# Patient Record
Sex: Male | Born: 1980 | Hispanic: Yes | Marital: Single | Smoking: Never smoker
Health system: Southern US, Community
[De-identification: ages and names within clinical notes are randomized; demographics above are authoritative.]

## PROBLEM LIST (undated history)

## (undated) DIAGNOSIS — I495 Sick sinus syndrome: Secondary | ICD-10-CM

## (undated) DIAGNOSIS — D490 Neoplasm of unspecified behavior of digestive system: Secondary | ICD-10-CM

## (undated) DIAGNOSIS — Z950 Presence of cardiac pacemaker: Secondary | ICD-10-CM

## (undated) DIAGNOSIS — K759 Inflammatory liver disease, unspecified: Secondary | ICD-10-CM

## (undated) DIAGNOSIS — D37039 Neoplasm of uncertain behavior of the major salivary glands, unspecified: Secondary | ICD-10-CM

## (undated) DIAGNOSIS — R220 Localized swelling, mass and lump, head: Secondary | ICD-10-CM

## (undated) DIAGNOSIS — K219 Gastro-esophageal reflux disease without esophagitis: Secondary | ICD-10-CM

## (undated) HISTORY — DX: Sick sinus syndrome: I49.5

## (undated) HISTORY — DX: Gastro-esophageal reflux disease without esophagitis: K21.9

## (undated) HISTORY — DX: Localized swelling, mass and lump, head: R22.0

## (undated) HISTORY — DX: Neoplasm of uncertain behavior of the major salivary glands, unspecified: D37.039

## (undated) HISTORY — DX: Presence of cardiac pacemaker: Z95.0

## (undated) HISTORY — DX: Neoplasm of unspecified behavior of digestive system: D49.0

## (undated) HISTORY — PX: LEG SURGERY: SHX1003

---

## 2005-05-01 DIAGNOSIS — Z950 Presence of cardiac pacemaker: Secondary | ICD-10-CM

## 2005-05-01 HISTORY — DX: Presence of cardiac pacemaker: Z95.0

## 2005-05-06 HISTORY — PX: PACEMAKER INSERTION: SHX728

## 2008-10-23 ENCOUNTER — Encounter: Payer: Self-pay | Admitting: Internal Medicine

## 2010-03-10 ENCOUNTER — Telehealth (INDEPENDENT_AMBULATORY_CARE_PROVIDER_SITE_OTHER): Payer: Self-pay | Admitting: *Deleted

## 2010-04-08 ENCOUNTER — Encounter (INDEPENDENT_AMBULATORY_CARE_PROVIDER_SITE_OTHER): Payer: Self-pay | Admitting: *Deleted

## 2010-04-18 ENCOUNTER — Encounter: Payer: Self-pay | Admitting: Internal Medicine

## 2010-04-18 ENCOUNTER — Ambulatory Visit: Payer: Self-pay | Admitting: Internal Medicine

## 2010-04-18 DIAGNOSIS — I495 Sick sinus syndrome: Secondary | ICD-10-CM | POA: Insufficient documentation

## 2010-05-28 ENCOUNTER — Ambulatory Visit: Payer: Self-pay | Admitting: Internal Medicine

## 2010-06-27 ENCOUNTER — Ambulatory Visit: Admit: 2010-06-27 | Payer: Self-pay | Admitting: Internal Medicine

## 2010-07-01 NOTE — Letter (Signed)
Summary: Primary Care Appointment Letter  [facility] [place]  [address] [place], [place] [address]   Phone: [phone]  Fax: [phone]    04/08/2010 MRN: [id number]  [name] [place], [place]  [address]  Dear Ms. Chase Parker,   Your Primary Care Physician  has indicated that:    _______it is time to schedule an appointment.    _______you missed your appointment on______ and need to call and          reschedule.    _______you need to have lab work done.    _______you need to schedule an appointment discuss lab or test results.    ___xxxx_you need to call to reschedule your appointment for a physical that is scheduled on 04/18/2010.   Dr Drue Novel will not be in that day.     Please call our office as soon as possible. Our phone number is [phone]. Please press option 1. Our office is open 8a-12noon and 1p-5p, Monday through Friday.     Thank you,     Sarah at [facility] [address]   [facility]

## 2010-07-01 NOTE — Letter (Signed)
Summary: Dr. Horton Chin - Electrophysiologist/Cardiologist  Dr. Horton Chin - Electrophysiologist/Cardiologist   Imported By: Marylou Mccoy 05/01/2010 16:24:09  _____________________________________________________________________  External Attachment:    Type:   Image     Comment:   External Document

## 2010-07-01 NOTE — Cardiovascular Report (Signed)
Summary: Office Visit   Office Visit   Imported By: Roderic Ovens 04/21/2010 15:54:25  _____________________________________________________________________  External Attachment:    Type:   Image     Comment:   External Document

## 2010-07-01 NOTE — Procedures (Signed)
Summary: Cardiology Device Clinic   [facility] Specifications Following MD:  Hillis Range, MD     PPM Vendor:  St Jude     PPM Model Number:  [id number]     PPM Serial Number:  [id number] PPM DOI:  05/05/2005     PPM Implanting MD:  NOT IMPLANTED BY [place]  Lead 1    Location: RA     DOI: 05/05/2005     Model #: [id number]     Serial #: [id number]     Status: active Lead 2    Location: RV     DOI: 05/05/2005     Model #: [id number]     Serial #: [id number]     Status: active  Magnet Response Rate:  BOL 98.6 ERI 86.3  Indications:  Syncope   PPM Follow Up Remote Check?  No Battery Voltage:  2.78 V     Battery Est. Longevity:  4.5 YEARS     Pacer Dependent:  No       PPM Device Measurements Atrium  Amplitude: 3.3 mV, Impedance: 558 ohms, Threshold: 0.5 V at 0.4 msec Right Ventricle  Amplitude: 9.3 mV, Impedance: 391 ohms, Threshold: 1.5 V at 1.0 msec  Episodes MS Episodes:  0     Percent Mode Switch:  0     Coumadin:  No Atrial Pacing:  7.7%     Ventricular Pacing:  <1%  Parameters Mode:  ddd     Lower Rate Limit:  55     Upper Rate Limit:  125 Paced AV Delay:  250     Sensed AV Delay:  225 Tech Comments:  No parameter changes.  New to our practice, implanted in [place] for syncope.  Checked by [employer].  ROV 6 months with Dr. Johney Frame. Altha Harm, LPN  April 18, 2010 2:01 PM

## 2010-07-01 NOTE — Assessment & Plan Note (Signed)
Summary: np6/syncope-mb   Visit Type:  Initial Consult  CC:  Cardiology consult.  History of Present Illness: patient is a 30 year old with a history of syncope in the past.  He was seen by a cardiologist in [place].   work up included a tilt table that showed sinus arrest.  he underwent PPM placement.  Since then he has no furhter episodes of syncope.  No dizziness.  He says he can occasionally feel a discomfort in chest as if it is his pacer kicking in when he increases his activity. Ne deneis other chest discomfort.  He does not exercise regularly He was last seen by cardiol about 7 months ago.  Pacer at time was working well.  He said he paces about 10% of time.   He moved to [facility] and presents for continued cardiac care. per his reprot lipids were good. in past.  Current Medications (verified): 1)  No Meds  Allergies (verified): No Known Drug Allergies  Past History:  Past Medical History: SA node dysfunction, s/p PPM  (St Jude) GERD  Past Surgical History: PPM 2996 Repair broken leg 1992  Family History: Reviewed history and no changes required. No premature CAD  Social History: Reviewed history and no changes required. No tobacco No ETOH Works for [employer].  Review of Systems       All systems reviewed.  Neg to the above problem except as noted.  Vital Signs:  Patient profile:   30 year old male Height:      67 inches Weight:      216.50 pounds BMI:     34.03 Pulse rate:   96 / minute Pulse rhythm:   regular Resp:     18 per minute BP sitting:   121 / 75  (left arm) Cuff size:   large  Vitals Entered By: Vikki Ports (April 18, 2010 11:05 AM)  Physical Exam  Additional Exam:  patient is in NAD HEENT:  Normocephalic, atraumatic. EOMI, PERRLA.  Neck: JVP is normal. No thyromegaly. No bruits.  Lungs: clear to auscultation. No rales no wheezes.  Heart: Regular rate and rhythm. Normal S1, S2. No S3.   No significant murmurs. PMI not  displaced.  Abdomen:  Supple, nontender. Normal bowel sounds. No masses. No hepatomegaly.  Extremities:   Good distal pulses throughout. No lower extremity edema.  Musculoskeletal :moving all extremities.  Neuro:   alert and oriented x3.    EKG  Procedure date:  04/18/2010  Findings:      NSR  96 bpm.  Impression & Recommendations:  Problem # 1:  SINOATRIAL NODE DYSFUNCTION (ICD-427.81) Patient is doing well. Pacer interrogated.   Working well.  Ventricular output increased to expand safety margin will need f/u with EP in future  will arrange for appt next spring Wating for records from [facility]  Problem # 2:  PREVENTIVE HEALTH CARE (ICD-V70.0) Counselled on exercise, diet Will need to have lipids checked once establishes with primary MD.  Other Orders: EKG w/ Interpretation (93000) Primary Care Referral (Primary)  Patient Instructions: 1)  Your physician recommends that you schedule a follow-up appointment in: 6 months with Dr.Allred for PACER CHECK 2)  Set up with Dr. Drue Novel for primary care.

## 2010-07-01 NOTE — Progress Notes (Signed)
  Pt signed ROI, needed to get records from Dr.John's office in [place] faxed to [phone] call back [phone] pt needs to [facility] NP appt [facility]  March 10, 2010 12:55 PM     Appended Document:  Records Received from Dr.John's Office in [place] gave to Peter ([employer])

## 2011-01-30 ENCOUNTER — Encounter: Payer: Self-pay | Admitting: *Deleted

## 2011-05-13 ENCOUNTER — Encounter: Payer: Self-pay | Admitting: Internal Medicine

## 2011-07-10 ENCOUNTER — Telehealth: Payer: Self-pay | Admitting: Internal Medicine

## 2011-07-10 NOTE — Telephone Encounter (Signed)
05-13-11 sent past due letter/mt °

## 2011-07-29 ENCOUNTER — Encounter: Payer: Self-pay | Admitting: Internal Medicine

## 2011-07-29 ENCOUNTER — Ambulatory Visit (INDEPENDENT_AMBULATORY_CARE_PROVIDER_SITE_OTHER): Payer: Private Health Insurance - Indemnity | Admitting: Internal Medicine

## 2011-07-29 VITALS — BP 120/78 | HR 58 | Ht 67.0 in | Wt 212.8 lb

## 2011-07-29 DIAGNOSIS — E781 Pure hyperglyceridemia: Secondary | ICD-10-CM

## 2011-07-29 DIAGNOSIS — I495 Sick sinus syndrome: Secondary | ICD-10-CM

## 2011-07-29 LAB — PACEMAKER DEVICE OBSERVATION
AL IMPEDENCE PM: 645 Ohm
AL THRESHOLD: 0.5 V
ATRIAL PACING PM: 8
BATTERY VOLTAGE: 2.76 V
RV LEAD IMPEDENCE PM: 351 Ohm

## 2011-07-29 NOTE — Patient Instructions (Signed)
Your physician wants you to follow-up in: 6 months with device clinic and 12 months with Dr Johney Frame Bonita Quin will receive a reminder letter in the mail two months in advance. If you don't receive a letter, please call our office to schedule the follow-up appointment.   You have been referred to Dr Drue Novel

## 2011-08-02 ENCOUNTER — Encounter: Payer: Self-pay | Admitting: Internal Medicine

## 2011-08-02 NOTE — Progress Notes (Signed)
Chase Parker is a 31 y.o. male with a h/o bradycardia sp PPM (SJM) at [facility] in [place]  who presents today to establish care in the Electrophysiology device clinic.  He reports initially having recurrent unexplained syncope.  A tilt table study was performed which revealed dramatic sinus node dysfunction.  He underwent PPM implantation at that time.  He has since done very well, without recurrent syncope. He is presently doing rather well.  He does not exercise regularly  Today, he  denies symptoms of palpitations, chest pain, shortness of breath, orthopnea, PND, lower extremity edema, dizziness, presyncope, syncope, or neurologic sequela.  The patientis tolerating medications without difficulties and is otherwise without complaint today.   Past Medical History  Diagnosis Date  . Sick sinus syndrome     s/p PPM implant in [place]  . GERD (gastroesophageal reflux disease)    Past Surgical History  Procedure Date  . Pacemaker insertion 05/06/2005    SJM implanted at [facility] in [place]    History   Social History  . Marital Status: Single    Spouse Name: N/A    Number of Children: N/A  . Years of Education: N/A   Occupational History  . Not on file.   Social History Main Topics  . Smoking status: Never Smoker   . Smokeless tobacco: Not on file  . Alcohol Use: No  . Drug Use: No  . Sexually Active: Not on file   Other Topics Concern  . Not on file   Social History Narrative  . No narrative on file   Pt unaware of any significant FH  No Known Allergies  No current outpatient prescriptions on file.    ROS- all systems are reviewed and negative except as per HPI  Physical Exam: Filed Vitals:   07/29/11 1553  BP: 120/78  Pulse: 58  Height: 5\' 7"  (1.702 m)  Weight: 212 lb 12.8 oz (96.525 kg)    GEN- The patient is well appearing, alert and oriented x 3 today.   Head- normocephalic, atraumatic Eyes-  Sclera clear, conjunctiva pink Ears- hearing  intact Oropharynx- clear Neck- supple, no JVP Lymph- no cervical lymphadenopathy Lungs- Clear to ausculation bilaterally, normal work of breathing Chest- pacemaker pocket is well healed Heart- Regular rate and rhythm, no murmurs, rubs or gallops, PMI not laterally displaced GI- soft, NT, ND, + BS Extremities- no clubbing, cyanosis, or edema MS- no significant deformity or atrophy Skin- no rash or lesion Psych- euthymic mood, full affect Neuro- strength and sensation are intact  Pacemaker interrogation- reviewed in detail today,  See PACEART report  Assessment and Plan:

## 2011-08-02 NOTE — Assessment & Plan Note (Signed)
Normal pacemaker function See Arita Miss Art report No changes today  Return in 12 months

## 2012-01-04 ENCOUNTER — Ambulatory Visit (INDEPENDENT_AMBULATORY_CARE_PROVIDER_SITE_OTHER): Payer: Private Health Insurance - Indemnity | Admitting: *Deleted

## 2012-01-04 ENCOUNTER — Encounter: Payer: Self-pay | Admitting: Internal Medicine

## 2012-01-04 DIAGNOSIS — I495 Sick sinus syndrome: Secondary | ICD-10-CM

## 2012-01-04 DIAGNOSIS — Z950 Presence of cardiac pacemaker: Secondary | ICD-10-CM | POA: Insufficient documentation

## 2012-01-04 LAB — PACEMAKER DEVICE OBSERVATION
AL AMPLITUDE: 5 mv
ATRIAL PACING PM: 8
BAMS-0001: 180 {beats}/min
BAMS-0003: 70 {beats}/min
BATTERY VOLTAGE: 2.78 V
VENTRICULAR PACING PM: 1.3

## 2012-01-04 NOTE — Progress Notes (Signed)
PPM check 

## 2012-07-04 ENCOUNTER — Encounter: Payer: Self-pay | Admitting: Internal Medicine

## 2012-07-04 ENCOUNTER — Ambulatory Visit (INDEPENDENT_AMBULATORY_CARE_PROVIDER_SITE_OTHER): Payer: Private Health Insurance - Indemnity | Admitting: Internal Medicine

## 2012-07-04 VITALS — BP 123/77 | HR 63 | Ht 67.0 in | Wt 222.0 lb

## 2012-07-04 DIAGNOSIS — I495 Sick sinus syndrome: Secondary | ICD-10-CM

## 2012-07-04 LAB — PACEMAKER DEVICE OBSERVATION
AL AMPLITUDE: 5 mv
BAMS-0001: 180 {beats}/min
DEVICE MODEL PM: 1546854
RV LEAD AMPLITUDE: 8.5 mv
RV LEAD IMPEDENCE PM: 329 Ohm
RV LEAD THRESHOLD: 1.25 V

## 2012-07-04 NOTE — Progress Notes (Signed)
PCP: MANNING, Adelene Amas., MD  Chase Parker is a 32 y.o. male who presents today for routine electrophysiology followup.  Since last being seen in our clinic, the patient reports doing very well.  Today, he denies symptoms of palpitations, chest pain, shortness of breath,  lower extremity edema, dizziness, presyncope, or syncope.  The patient is otherwise without complaint today.   Past Medical History  Diagnosis Date  . Sick sinus syndrome     s/p PPM implant in [place]  . GERD (gastroesophageal reflux disease)    Past Surgical History  Procedure Date  . Pacemaker insertion 05/06/2005    SJM implanted at [facility] in [place]    No current outpatient prescriptions on file.    Physical Exam: Filed Vitals:   07/04/12 1423  BP: 123/77  Pulse: 63  Height: 5\' 7"  (1.702 m)  Weight: 222 lb (100.699 kg)    GEN- The patient is well appearing, alert and oriented x 3 today.   Head- normocephalic, atraumatic Eyes-  Sclera clear, conjunctiva pink Ears- hearing intact Oropharynx- clear Lungs- Clear to ausculation bilaterally, normal work of breathing Chest- pacemaker pocket is well healed Heart- Regular rate and rhythm, no murmurs, rubs or gallops, PMI not laterally displaced GI- soft, NT, ND, + BS Extremities- no clubbing, cyanosis, or edema  Pacemaker interrogation- reviewed in detail today,  See PACEART report  Assessment and Plan:  1. Bradycardia Normal pacemaker function See Arita Miss Art report No changes today  Return to device clinic in 6 months and I will see in a year

## 2013-03-24 ENCOUNTER — Encounter: Payer: Self-pay | Admitting: *Deleted

## 2013-04-06 ENCOUNTER — Telehealth: Payer: Self-pay | Admitting: Internal Medicine

## 2013-04-06 NOTE — Telephone Encounter (Signed)
New message     Pt want to schedule a pacer ck only.  He said he has it checked every 6months.  I see the recall for the pacer ck with Dr Johney Frame, but he said that was not the appt he wanted to schedule.  Will you pls call him and schedule this----I do not feel comfortable scheduling this without a recall tab.

## 2013-07-21 ENCOUNTER — Encounter: Payer: Self-pay | Admitting: Internal Medicine

## 2013-07-21 ENCOUNTER — Ambulatory Visit (INDEPENDENT_AMBULATORY_CARE_PROVIDER_SITE_OTHER): Payer: Private Health Insurance - Indemnity | Admitting: Internal Medicine

## 2013-07-21 VITALS — BP 110/72 | HR 90 | Ht 67.0 in | Wt 227.0 lb

## 2013-07-21 DIAGNOSIS — I495 Sick sinus syndrome: Secondary | ICD-10-CM

## 2013-07-21 LAB — MDC_IDC_ENUM_SESS_TYPE_INCLINIC
Battery Impedance: 1000 Ohm
Battery Voltage: 2.78 V
Implantable Pulse Generator Serial Number: 1546854
Lead Channel Impedance Value: 315 Ohm
Lead Channel Impedance Value: 598 Ohm
Lead Channel Pacing Threshold Amplitude: 1.25 V
Lead Channel Pacing Threshold Pulse Width: 1 ms
Lead Channel Setting Pacing Amplitude: 2 V
Lead Channel Setting Pacing Amplitude: 2.5 V
Lead Channel Setting Sensing Sensitivity: 2 mV
MDC IDC MSMT LEADCHNL RA PACING THRESHOLD AMPLITUDE: 0.5 V
MDC IDC MSMT LEADCHNL RA PACING THRESHOLD PULSEWIDTH: 0.4 ms
MDC IDC MSMT LEADCHNL RA SENSING INTR AMPL: 5 mV
MDC IDC MSMT LEADCHNL RV SENSING INTR AMPL: 11.1 mV
MDC IDC SESS DTM: 20150220140241
MDC IDC SET LEADCHNL RV PACING PULSEWIDTH: 1 ms
MDC IDC STAT BRADY RA PERCENT PACED: 6.6 %
MDC IDC STAT BRADY RV PERCENT PACED: 1.4 %

## 2013-07-21 NOTE — Progress Notes (Signed)
PCP: MANNING, Drue Stager., MD  Chase Parker is a 33 y.o. male who presents today for routine electrophysiology followup.  Since last being seen in our clinic, the patient reports doing very well.  Today, he denies symptoms of palpitations, chest pain, shortness of breath,  lower extremity edema, dizziness, presyncope, or syncope.  The patient is otherwise without complaint today.   Past Medical History  Diagnosis Date  . Sick sinus syndrome     s/p PPM implant in [place]  . GERD (gastroesophageal reflux disease)    Past Surgical History  Procedure Laterality Date  . Pacemaker insertion  05/06/2005    SJM implanted at [facility] in [place]    No current outpatient prescriptions on file.   No current facility-administered medications for this visit.    Physical Exam: Filed Vitals:   07/21/13 1123  BP: 110/72  Pulse: 90  Height: 5\' 7"  (1.702 m)  Weight: 227 lb (102.967 kg)    GEN- The patient is well appearing, alert and oriented x 3 today.   Head- normocephalic, atraumatic Eyes-  Sclera clear, conjunctiva pink Ears- hearing intact Oropharynx- clear Lungs- Clear to ausculation bilaterally, normal work of breathing Chest- pacemaker pocket is well healed Heart- Regular rate and rhythm, no murmurs, rubs or gallops, PMI not laterally displaced GI- soft, NT, ND, + BS Extremities- no clubbing, cyanosis, or edema  Pacemaker interrogation- reviewed in detail today,  See PACEART report  Assessment and Plan:  1. Sick sinus syndrome  Normal pacemaker function See Pace Art report No changes today  2. Obesity Lifestyle modification discussed at length today  Return to device clinic in 6 months and I will see in a year

## 2013-07-21 NOTE — Patient Instructions (Signed)
Your physician recommends that you schedule a follow-up appointment in: 6 months with the device clinic  Your physician recommends that you schedule a follow-up appointment in: 12 months with Dr.Allred

## 2013-07-28 ENCOUNTER — Encounter: Payer: Self-pay | Admitting: Internal Medicine

## 2013-09-04 DIAGNOSIS — J309 Allergic rhinitis, unspecified: Secondary | ICD-10-CM | POA: Insufficient documentation

## 2014-02-23 ENCOUNTER — Encounter: Payer: Self-pay | Admitting: *Deleted

## 2014-04-11 ENCOUNTER — Encounter: Payer: Self-pay | Admitting: *Deleted

## 2014-05-29 ENCOUNTER — Encounter: Payer: Self-pay | Admitting: *Deleted

## 2014-07-30 ENCOUNTER — Encounter: Payer: Self-pay | Admitting: Internal Medicine

## 2014-07-30 ENCOUNTER — Ambulatory Visit (INDEPENDENT_AMBULATORY_CARE_PROVIDER_SITE_OTHER): Payer: Managed Care, Other (non HMO) | Admitting: Internal Medicine

## 2014-07-30 VITALS — BP 124/72 | HR 88 | Ht 66.75 in | Wt 203.0 lb

## 2014-07-30 DIAGNOSIS — I495 Sick sinus syndrome: Secondary | ICD-10-CM

## 2014-07-30 LAB — MDC_IDC_ENUM_SESS_TYPE_INCLINIC
Lead Channel Impedance Value: 297 Ohm
Lead Channel Impedance Value: 598 Ohm
Lead Channel Pacing Threshold Pulse Width: 1 ms
Lead Channel Setting Pacing Amplitude: 2 V
Lead Channel Setting Pacing Amplitude: 2.5 V
Lead Channel Setting Sensing Sensitivity: 2 mV
MDC IDC MSMT BATTERY IMPEDANCE: 1300 Ohm
MDC IDC MSMT BATTERY VOLTAGE: 2.78 V
MDC IDC MSMT LEADCHNL RA PACING THRESHOLD AMPLITUDE: 0.5 V
MDC IDC MSMT LEADCHNL RA PACING THRESHOLD PULSEWIDTH: 0.4 ms
MDC IDC MSMT LEADCHNL RA SENSING INTR AMPL: 5 mV
MDC IDC MSMT LEADCHNL RV PACING THRESHOLD AMPLITUDE: 1.25 V
MDC IDC MSMT LEADCHNL RV SENSING INTR AMPL: 9.3 mV
MDC IDC PG SERIAL: 1546854
MDC IDC SESS DTM: 20160229140317
MDC IDC SET LEADCHNL RV PACING PULSEWIDTH: 1 ms

## 2014-07-30 NOTE — Progress Notes (Signed)
   Electrophysiology Office Note   Date:  07/30/2014   ID:  Chase Parker, DOB [birth year], MRN [id number]  PCP:  Kathlene November, MD   Primary Electrophysiologist: Thompson Grayer, MD    Chief Complaint  Patient presents with  . Follow-up    SA node dysfunction     History of Present Illness: Chase Parker is a 34 y.o. male who presents today for electrophysiology evaluation.   He is doing very well .  He has lost 25 lbs since I saw him last year.   Today, he denies symptoms of palpitations, chest pain, shortness of breath, orthopnea, PND, lower extremity edema, claudication, dizziness, presyncope, syncope, bleeding, or neurologic sequela. The patient is tolerating medications without difficulties and is otherwise without complaint today.    Past Medical History  Diagnosis Date  . Sick sinus syndrome     s/p PPM implant in [place]  . GERD (gastroesophageal reflux disease)    Past Surgical History  Procedure Laterality Date  . Pacemaker insertion  05/06/2005    SJM implanted at [facility] in [place]     No current outpatient prescriptions on file.   No current facility-administered medications for this visit.    Allergies:   Review of patient's allergies indicates no known allergies.   Social History:  The patient  reports that he has never smoked. He does not have any smokeless tobacco history on file. He reports that he does not drink alcohol or use illicit drugs.    ROS:  Please see the history of present illness.   All other systems are reviewed and negative.    PHYSICAL EXAM: VS:  BP 124/72 mmHg  Pulse 88  Ht 5' 6.75" (1.695 m)  Wt 203 lb (92.08 kg)  BMI 32.05 kg/m2 , BMI Body mass index is 32.05 kg/(m^2). GEN: Well nourished, well developed, in no acute distress HEENT: normal Neck: no JVD, carotid bruits, or masses Cardiac: RRR; no murmurs, rubs, or gallops,no edema  Respiratory:  clear to auscultation bilaterally, normal work of breathing GI: soft, nontender,  nondistended, + BS MS: no deformity or atrophy Skin: warm and dry, device pocket is well healed Neuro:  Strength and sensation are intact Psych: euthymic mood, full affect  Device interrogation is reviewed today in detail.  See PaceArt for details.   Recent Labs: No results found for requested labs within last 365 days.    Lipid Panel  No results found for: CHOL, TRIG, HDL, CHOLHDL, VLDL, LDLCALC, LDLDIRECT   Wt Readings from Last 3 Encounters:  07/30/14 203 lb (92.08 kg)  07/21/13 227 lb (102.967 kg)  07/04/12 222 lb (100.699 kg)      ASSESSMENT AND PLAN:  1.  Sick sinus syndrome Normal pacemaker function See Pace Art report No changes today  2. Obesity Weight loss encouraged He is doing very well with this   Return to se device clinic RN in 6 months I will see again in 1 year  Signed, Thompson Grayer, MD  07/30/2014 11:13 AM     [facility] [address] [place] [address] [phone] (office) [phone] (fax)

## 2014-07-30 NOTE — Patient Instructions (Signed)
Your physician wants you to follow-up in: 6 months in device clinic and 12 months with Dr. Rayann Heman. You will receive a reminder letter in the mail two months in advance. If you don't receive a letter, please call our office to schedule the follow-up appointment.

## 2014-08-07 ENCOUNTER — Ambulatory Visit (INDEPENDENT_AMBULATORY_CARE_PROVIDER_SITE_OTHER): Payer: Managed Care, Other (non HMO) | Admitting: Internal Medicine

## 2014-08-07 ENCOUNTER — Encounter: Payer: Self-pay | Admitting: Internal Medicine

## 2014-08-07 VITALS — BP 118/72 | HR 68 | Temp 97.6°F | Ht 67.0 in | Wt 199.1 lb

## 2014-08-07 DIAGNOSIS — Z0000 Encounter for general adult medical examination without abnormal findings: Secondary | ICD-10-CM | POA: Diagnosis not present

## 2014-08-07 DIAGNOSIS — R221 Localized swelling, mass and lump, neck: Secondary | ICD-10-CM

## 2014-08-07 MED ORDER — SILDENAFIL CITRATE 100 MG PO TABS: 50.0000 mg | ORAL_TABLET | Freq: Every day | ORAL | Status: DC | PRN

## 2014-08-07 NOTE — Assessment & Plan Note (Addendum)
Had a Td-- will get records Diet and exercise discussed Labs Self testicular exam. Sexual orientation-- gay, reports inconsistent use of condoms-- counseled   Other issues: Mass, left neck. Likely a enlarged salivary gland, will refer to ENT noting no systemic symptoms or other masses. Rash, saw dermatology earlier today, felt to be eczema versus psoriasis ED-- tried Cialis, did not work very well, recommend Viagra as needed Follow-up one year

## 2014-08-07 NOTE — Progress Notes (Signed)
Subjective:    Patient ID: Chase Parker, male    DOB: November 01, 1980, 34 y.o.   MRN: [id number]  DOS:  08/07/2014 Type of visit - description : new pt,  Transferring from [place] (the office  stopped providing primary care services), request a physical exam Iin general feeling well. Reports a "gland" at the left side of the neck, has grown in size over the last year, nontender.   Review of Systems Constitutional: No fever, chills. No unexplained wt changes. No unusual sweats HEENT: No dental problems, ear discharge, facial swelling, voice changes. No eye discharge, redness or intolerance to light Respiratory: No wheezing or difficulty breathing. No cough , mucus production Cardiovascular: No CP, leg swelling or palpitations GI: no nausea, vomiting, diarrhea or abdominal pain.  No blood in the stools. No dysphagia   Endocrine: No polyphagia, polyuria or polydipsia GU: No dysuria, gross hematuria, difficulty urinating. No urinary urgency or frequency. Musculoskeletal: No joint swellings or unusual aches or pains Skin: No change in the color of the skin, palor ; has a rash at the right pretibial area, saw dermatology today Allergic, immunologic: No environmental allergies or food allergies Neurological: No dizziness or syncope. No headaches. No diplopia, slurred speech, motor deficits, facial numbness Hematological: No enlarged lymph nodes, easy bruising or bleeding Psychiatry: No suicidal ideas, hallucinations, behavior problems or confusion. No unusual/severe anxiety or depression.     Past Medical History  Diagnosis Date  . Sick sinus syndrome     s/p PPM implant in [place]  . GERD (gastroesophageal reflux disease)     Past Surgical History  Procedure Laterality Date  . Pacemaker insertion  05/06/2005    SJM implanted at [facility] in [place] History  . Marital Status: Single    Spouse Name: N/A  . Number of Children: 0  . Years of Education: N/A    Occupational History  . works at [employer], [employer]    Social History Main Topics  . Smoking status: Never Smoker   . Smokeless tobacco: Not on file  . Alcohol Use: No     Comment: very rarely  . Drug Use: No  . Sexual Activity: Not on file   Other Topics Concern  . Not on file   Social History Narrative   Original from [place], in [place] since 2011   Lives by himself, 1 dog   Sex orientation gay     Family History  Problem Relation Age of Onset  . Diabetes Other     father side   . CAD Neg Hx   . Colon cancer Neg Hx   . Prostate cancer Neg Hx   . Breast cancer Other     aunts  . Ovarian cancer Mother        Medication List       This list is accurate as of: 08/07/14 11:59 PM.  Always use your most recent med list.               sildenafil 100 MG tablet  Commonly known as:  VIAGRA  Take 0.5-1 tablets (50-100 mg total) by mouth daily as needed for erectile dysfunction.           Objective:   Physical Exam  Neck:     BP 118/72 mmHg  Pulse 68  Temp(Src) 97.6 F (36.4 C) (Oral)  Ht 5\' 7"  (1.702 m)  Wt 199 lb 2 oz (90.323 kg)  BMI 31.18 kg/m2  SpO2 99% General:   Well developed, well nourished . NAD.  Neck:  Full range of motion. Supple. No  Thyromegaly  HEENT:  Normocephalic . Face symmetric, atraumatic.  TMs normal, throat symmetric, uvula midline, no redness or discharge, tonsils a small Lungs:  CTA B Normal respiratory effort, no intercostal retractions, no accessory muscle use. Heart: RRR,  no murmur.  Abdomen:  Not distended, soft, non-tender. No rebound or rigidity. No mass,organomegaly Muscle skeletal: no pretibial edema bilaterally  Lymphnodes: none at armpit or groin Skin: Exposed areas without rash. Not pale. Not jaundice Neurologic:  alert & oriented X3.  Speech normal, gait appropriate for age and unassisted Strength symmetric and appropriate for age.  Psych: Cognition and judgment appear intact.  Cooperative with  normal attention span and concentration.  Behavior appropriate. No anxious or depressed appearing.       Assessment & Plan:

## 2014-08-07 NOTE — Progress Notes (Signed)
Pre visit review using our clinic review tool, if applicable. No additional management support is needed unless otherwise documented below in the visit note. 

## 2014-08-07 NOTE — Patient Instructions (Signed)
Please schedule labs to be done within few days (fasting)   Come back to the office in 1 year   for a physical exam  Please schedule an appointment at the front desk    Come back fasting        Testicular Self-Exam A self-examination of your testicles involves looking at and feeling your testicles for abnormal lumps or swelling. Several things can cause swelling, lumps, or pain in your testicles. Some of these causes are:  Injuries.  Inflammation.  Infection.  Accumulation of fluids around your testicle (hydrocele).  Twisted testicles (testicular torsion).  Testicular cancer. Self-examination of the testicles and groin areas may be advised if you are at risk for testicular cancer. Risks for testicular cancer include:  An undescended testicle (cryptorchidism).  A history of previous testicular cancer.  A family history of testicular cancer. The testicles are easiest to examine after warm baths or showers and are more difficult to examine when you are cold. This is because the muscles attached to the testicles retract and pull them up higher or into the abdomen. Follow these steps while you are standing:  Hold your penis away from your body.  Roll one testicle between your thumb and forefinger, feeling the entire testicle.  Roll the other testicle between your thumb and forefinger, feeling the entire testicle. Feel for lumps, swelling, or discomfort. A normal testicle is egg shaped and feels firm. It is smooth and not tender. The spermatic cord can be felt as a firm spaghetti-like cord at the back of your testicle. It is also important to examine the crease between the front of your leg and your abdomen. Feel for any bumps that are tender. These could be enlarged lymph nodes.  Document Released: 08/24/2000 Document Revised: 01/18/2013 Document Reviewed: 11/07/2012 [facility] Patient Information 2015 [place], [place]. This information is not intended to replace advice given to you  by your health care provider. Make sure you discuss any questions you have with your health care provider.    Safe Sex Safe sex is about reducing the risk of giving or getting a sexually transmitted disease (STD). STDs are spread through sexual contact involving the genitals, mouth, or rectum. Some STDs can be cured and others cannot. Safe sex can also prevent unintended pregnancies.  WHAT ARE SOME SAFE SEX PRACTICES?  Limit your sexual activity to only one partner who is having sex with only you.  Talk to your partner about his or her past partners, past STDs, and drug use.  Use a condom every time you have sexual intercourse. This includes vaginal, oral, and anal sexual activity. Both females and males should wear condoms during oral sex. Only use latex or polyurethane condoms and water-based lubricants. Using petroleum-based lubricants or oils to lubricate a condom will weaken the condom and increase the chance that it will break. The condom should be in place from the beginning to the end of sexual activity. Wearing a condom reduces, but does not completely eliminate, your risk of getting or giving an STD. STDs can be spread by contact with infected body fluids and skin.  Get vaccinated for hepatitis B and HPV.  Avoid alcohol and recreational drugs, which can affect your judgment. You may forget to use a condom or participate in high-risk sex.  For females, avoid douching after sexual intercourse. Douching can spread an infection farther into the reproductive tract.  Check your body for signs of sores, blisters, rashes, or unusual discharge. See your health care provider if  you notice any of these signs.  Avoid sexual contact if you have symptoms of an infection or are being treated for an STD. If you or your partner has herpes, avoid sexual contact when blisters are present. Use condoms at all other times.  If you are at risk of being infected with HIV, it is recommended that you take a  prescription medicine daily to prevent HIV infection. This is called pre-exposure prophylaxis (PrEP). You are considered at risk if:  You are a man who has sex with other men (MSM).  You are a heterosexual man or woman who is sexually active with more than one partner.  You take drugs by injection.  You are sexually active with a partner who has HIV.  Talk with your health care provider about whether you are at high risk of being infected with HIV. If you choose to begin PrEP, you should first be tested for HIV. You should then be tested every 3 months for as long as you are taking PrEP.  See your health care provider for regular screenings, exams, and tests for other STDs. Before having sex with a new partner, each of you should be screened for STDs and should talk about the results with each other. WHAT ARE THE BENEFITS OF SAFE SEX?   There is less chance of getting or giving an STD.  You can prevent unwanted or unintended pregnancies.  By discussing safe sex concerns with your partner, you may increase feelings of intimacy, comfort, trust, and honesty between the two of you. Document Released: 06/25/2004 Document Revised: 10/02/2013 Document Reviewed: 11/09/2011 [facility] Patient Information 2015 [place], [place]. This information is not intended to replace advice given to you by your health care provider. Make sure you discuss any questions you have with your health care provider.

## 2014-08-09 ENCOUNTER — Telehealth: Payer: Self-pay | Admitting: Internal Medicine

## 2014-08-09 ENCOUNTER — Other Ambulatory Visit (INDEPENDENT_AMBULATORY_CARE_PROVIDER_SITE_OTHER): Payer: Managed Care, Other (non HMO)

## 2014-08-09 DIAGNOSIS — Z0000 Encounter for general adult medical examination without abnormal findings: Secondary | ICD-10-CM

## 2014-08-09 DIAGNOSIS — R7989 Other specified abnormal findings of blood chemistry: Secondary | ICD-10-CM

## 2014-08-09 LAB — COMPREHENSIVE METABOLIC PANEL
ALT: 34 U/L (ref 0–53)
AST: 23 U/L (ref 0–37)
Albumin: 4.6 g/dL (ref 3.5–5.2)
Alkaline Phosphatase: 63 U/L (ref 39–117)
BILIRUBIN TOTAL: 0.5 mg/dL (ref 0.2–1.2)
BUN: 19 mg/dL (ref 6–23)
CALCIUM: 9.9 mg/dL (ref 8.4–10.5)
CHLORIDE: 103 meq/L (ref 96–112)
CO2: 30 mEq/L (ref 19–32)
CREATININE: 1.21 mg/dL (ref 0.40–1.50)
GFR: 73.4 mL/min (ref >60.00)
Glucose, Bld: 96 mg/dL (ref 70–99)
Potassium: 4.2 mEq/L (ref 3.5–5.1)
Sodium: 138 mEq/L (ref 135–145)
Total Protein: 7.5 g/dL (ref 6.0–8.3)

## 2014-08-09 LAB — CBC WITH DIFFERENTIAL/PLATELET
Basophils Absolute: 0 10*3/uL (ref 0.0–0.1)
Basophils Relative: 0.8 % (ref 0.0–3.0)
EOS ABS: 0.3 10*3/uL (ref 0.0–0.7)
Eosinophils Relative: 4.6 % (ref 0.0–5.0)
HEMATOCRIT: 46.4 % (ref 39.0–52.0)
HEMOGLOBIN: 16 g/dL (ref 13.0–17.0)
LYMPHS ABS: 2.1 10*3/uL (ref 0.7–4.0)
Lymphocytes Relative: 37.1 % (ref 12.0–46.0)
MCHC: 34.5 g/dL (ref 30.0–36.0)
MCV: 82.1 fl (ref 78.0–100.0)
MONO ABS: 0.4 10*3/uL (ref 0.1–1.0)
Monocytes Relative: 7.4 % (ref 3.0–12.0)
NEUTROS ABS: 2.8 10*3/uL (ref 1.4–7.7)
Neutrophils Relative %: 50.1 % (ref 43.0–77.0)
Platelets: 231 10*3/uL (ref 150.0–400.0)
RBC: 5.65 Mil/uL (ref 4.22–5.81)
RDW: 13.3 % (ref 11.5–15.5)
WBC: 5.7 10*3/uL (ref 4.0–10.5)

## 2014-08-09 LAB — TSH: TSH: 1.6 u[IU]/mL (ref 0.35–4.50)

## 2014-08-09 LAB — LIPID PANEL
Cholesterol: 211 mg/dL — ABNORMAL HIGH (ref 0–200)
HDL: 40.4 mg/dL (ref >39.00)
NonHDL: 170.6
Total CHOL/HDL Ratio: 5
Triglycerides: 269 mg/dL — ABNORMAL HIGH (ref 0.0–149.0)
VLDL: 53.8 mg/dL — AB (ref 0.0–40.0)

## 2014-08-09 LAB — LDL CHOLESTEROL, DIRECT: Direct LDL: 135 mg/dL

## 2014-08-09 LAB — RPR

## 2014-08-09 LAB — HIV ANTIBODY (ROUTINE TESTING W REFLEX): HIV: NONREACTIVE

## 2014-08-09 NOTE — Telephone Encounter (Signed)
Error

## 2014-08-13 ENCOUNTER — Encounter: Payer: Self-pay | Admitting: Internal Medicine

## 2014-12-31 DIAGNOSIS — R220 Localized swelling, mass and lump, head: Secondary | ICD-10-CM

## 2014-12-31 HISTORY — DX: Localized swelling, mass and lump, head: R22.0

## 2015-01-08 ENCOUNTER — Other Ambulatory Visit: Payer: Self-pay | Admitting: Otolaryngology

## 2015-01-08 ENCOUNTER — Other Ambulatory Visit (HOSPITAL_COMMUNITY)
Admission: RE | Admit: 2015-01-08 | Discharge: 2015-01-08 | Disposition: A | Discharge status: Home / Self Care | Payer: Managed Care, Other (non HMO) | Source: Ambulatory Visit | Attending: Otolaryngology | Admitting: Otolaryngology

## 2015-01-08 DIAGNOSIS — R220 Localized swelling, mass and lump, head: Secondary | ICD-10-CM | POA: Insufficient documentation

## 2015-01-11 ENCOUNTER — Other Ambulatory Visit: Payer: Self-pay | Admitting: Otolaryngology

## 2015-01-11 DIAGNOSIS — R220 Localized swelling, mass and lump, head: Secondary | ICD-10-CM

## 2015-01-11 DIAGNOSIS — R221 Localized swelling, mass and lump, neck: Principal | ICD-10-CM

## 2015-01-16 ENCOUNTER — Ambulatory Visit
Admission: RE | Admit: 2015-01-16 | Discharge: 2015-01-16 | Disposition: A | Discharge status: Home / Self Care | Payer: Managed Care, Other (non HMO) | Source: Ambulatory Visit | Attending: Otolaryngology | Admitting: Otolaryngology

## 2015-01-16 DIAGNOSIS — R220 Localized swelling, mass and lump, head: Secondary | ICD-10-CM

## 2015-01-16 DIAGNOSIS — R221 Localized swelling, mass and lump, neck: Principal | ICD-10-CM

## 2015-01-16 MED ORDER — IOPAMIDOL (ISOVUE-300) INJECTION 61%
75.0000 mL | Freq: Once | INTRAVENOUS | Status: AC | PRN
  Administered 2015-01-16: 75 mL via INTRAVENOUS

## 2015-01-24 ENCOUNTER — Telehealth: Payer: Self-pay | Admitting: Internal Medicine

## 2015-01-24 DIAGNOSIS — R221 Localized swelling, mass and lump, neck: Secondary | ICD-10-CM

## 2015-01-24 NOTE — Telephone Encounter (Signed)
Agree, please arrange the referral. Send a copy of the pathology report and CAT scans

## 2015-01-24 NOTE — Telephone Encounter (Signed)
Please advise 

## 2015-01-24 NOTE — Telephone Encounter (Signed)
Pt is needing a referral to [facility] ENT/Oncologist Dr. Cephus Shelling or Dr. Merry Proud Pt has tumor in neck and wants to see one of them for a 2nd opinion. Pt went to [facility]NT and a biopsy was done that was inconclusive.  Before committing to surgery he wants to get a 2nd opinion and he called and was told they need Dr. Larose Kells to send a referral. Please call pt with concerns.

## 2015-01-25 NOTE — Telephone Encounter (Signed)
Referral placed.

## 2015-02-13 ENCOUNTER — Ambulatory Visit (INDEPENDENT_AMBULATORY_CARE_PROVIDER_SITE_OTHER): Payer: Managed Care, Other (non HMO) | Admitting: *Deleted

## 2015-02-13 DIAGNOSIS — I495 Sick sinus syndrome: Secondary | ICD-10-CM | POA: Diagnosis not present

## 2015-02-13 LAB — CUP PACEART INCLINIC DEVICE CHECK
Battery Impedance: 1500 Ohm
Battery Voltage: 2.76 V
Brady Statistic RA Percent Paced: 11 %
Brady Statistic RV Percent Paced: 1.6 %
Lead Channel Impedance Value: 300 Ohm
Lead Channel Impedance Value: 585 Ohm
Lead Channel Pacing Threshold Pulse Width: 0.4 ms
Lead Channel Pacing Threshold Pulse Width: 1 ms
Lead Channel Sensing Intrinsic Amplitude: 5 mV
Lead Channel Sensing Intrinsic Amplitude: 7.6 mV
Lead Channel Setting Pacing Amplitude: 2 V
Lead Channel Setting Pacing Pulse Width: 1 ms
MDC IDC MSMT LEADCHNL RA PACING THRESHOLD AMPLITUDE: 0.5 V
MDC IDC MSMT LEADCHNL RV PACING THRESHOLD AMPLITUDE: 1.25 V
MDC IDC PG SERIAL: 1546854
MDC IDC SESS DTM: 20160914170309
MDC IDC SET LEADCHNL RV PACING AMPLITUDE: 2.5 V
MDC IDC SET LEADCHNL RV SENSING SENSITIVITY: 2 mV
Pulse Gen Model: 5816

## 2015-02-13 NOTE — Progress Notes (Signed)
Pacemaker check in clinic. Normal device function. Thresholds, sensing, impedances consistent with previous measurements. Device programmed to maximize longevity. No mode switch or high ventricular rates noted. Device programmed at appropriate safety margins. Histogram distribution appropriate for patient activity level. Device programmed to optimize intrinsic conduction. Estimated longevity 3.75-8.5 years. ROV with JA in February.

## 2015-02-22 ENCOUNTER — Encounter: Payer: Self-pay | Admitting: Internal Medicine

## 2015-02-25 ENCOUNTER — Other Ambulatory Visit: Payer: Self-pay | Admitting: Otolaryngology

## 2015-02-25 NOTE — H&P (Signed)
Elihu, Milstein 34 y.o., male [id number]     Chief Complaint: LEFT submandibular tumor  HPI: 34 year old white male comes in for evaluation of LEFT upper neck mass.  It has been present and growing slowly for perhaps as much as 5 years.  No pain.  No difficulty speaking or swallowing.  It does not swell with meals.  No external drainage or suggestion of infection at any point.  No prior  evaluation.  No fevers or night sweats.  No change in weight, appetite, or energy.  No masses elsewhere in his body including axillae and groins.   He has a history of sick sinus syndrome for which he has a pacemaker.  To his understanding, he paces  less than 10% of beats.  we received the fine needle aspiration cytology report of the LEFT submandibular gland.  I spoke with Dr. Saralyn Pilar, pathologist, about this.  the specimen shows neoplasm.  Dr. Saralyn Pilar favors benign but not completely certain.  We have a CT scan pending.  This will need a surgical removal with some margin.  I will call and discuss this with him.  I will also call him with the CT results.  Preoperative visit prior to excision, LEFT submandibular tumor.  I discussed the surgery with him in detail including risks and complications.  We discussed the options related to a benign versus malignant diagnosis.  I discussed advancement of diet and activity after surgery.  We will see him back 10 days after surgery for suture removal.  I gave him a prescription for hydrocodone 5/325 for pain relief afterwards.  he may also use ibuprofen.   he did receive a second opinion from Dr. Camila Li at [facility] [place] [facility].   He has seen a cardiologist recently and had his pacemaker interrogated.  It is functioning adequately. Amended Jodi Marble  M.D.; 61/95/0932 2:43 PM EST.  PMH: Past Medical History  Diagnosis Date  . Sick sinus syndrome     s/p PPM implant in [place]  . GERD (gastroesophageal reflux disease)   . 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 Ignacia Bayley  . Mass of left submandibular region 12/2014    CT scan of neck performed on 01/16/2015, showed no obvious invasion of surrounding structure, no lymphadenopathy, plan: left submandibular gland exicision intending adequate surgical margins  . 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 Neoplasm of submandibular gland     Dr. Vicie Mutters at [facility], recommends removal and node dissection    Surg Hx: Past Surgical History  Procedure Laterality Date  . Pacemaker insertion  05/06/2005    SJM implanted at [facility] in [place]    FHx:   Family History  Problem Relation Age of Onset  . Diabetes Other     father side   . CAD Neg Hx   . Colon cancer Neg Hx   . Prostate cancer Neg Hx   . Breast cancer Other     aunts  . Ovarian cancer Mother    SocHx:  reports that he has never smoked. He does not have any smokeless tobacco history on file. He reports that he does not drink alcohol or use illicit drugs.  ALLERGIES: No Known Allergies   (Not in a hospital admission)  No results found for this or any previous visit (from the past 48 hour(s)). No results found.  IZT:IWPYKDXI: Not feeling tired (fatigue).  No fever, no night sweats, and no  recent weight loss. Head: No headache. Eyes: No eye symptoms. Otolaryngeal: No hearing loss, no earache, no tinnitus, and no purulent nasal discharge.  No nasal passage blockage (stuffiness), no snoring, no sneezing, no hoarseness, and no sore throat. Cardiovascular: No chest pain or discomfort  and no palpitations. Pulmonary: No dyspnea, no cough, and no wheezing. Gastrointestinal: No dysphagia  and no heartburn.  No nausea, no abdominal pain, and no melena.  No diarrhea. Genitourinary: No dysuria. Endocrine: No muscle weakness. Musculoskeletal: No calf muscle cramps, no arthralgias, and no soft tissue swelling. Neurological: No dizziness, no fainting, no tingling, and no  numbness. Psychological: No anxiety  and no depression. Skin: No rash.  BP:114/76,  HR: 60 b/min,  BSA Calculated: 2.00 ,  BMI Calculated: 32.12 ,  Weight: 199 lb , BMI: 32.1 kg/m2,  Height: 5 ft 6 in.   PHYSICAL EXAM: This is a muscular, stocky, and healthy-appearing adult white male.  Mental status is sharp.  He appears well in conversational speech.  Voice is clear and respirations unlabored through the nose.  The head is atraumatic and neck supple.  Cranial nerves intact.  Ear canals are clear with normal drums.  Anterior nose is moist and patent.  Oral cavity is moist with teeth in good repair.  He has full normal tongue mobility.  Oropharynx is clear with normal soft palate and small tonsils.  Neck examination is remarkable for a roughly 4 cm firm mass in the LEFT submandibular gland.  No adenopathy.  He is somewhat stocky but healthy appearing.  Mental status is appropriate.  He hears well in conversational speech.  Voice is clear and respirations unlabored through the nose.  The head is atraumatic and neck supple.  Ear canals are clear with normal drums.  Anterior nose is moist and patent.  Oral cavity is clear with teeth in good repair.  Oropharynx clear.  Neck with a 3 cm LEFT submandibular firm but not rock hard fullness.  Ramus mandibularis is intact both sides.   Lungs: Clear to auscultation Heart: Regular rate and rhythm without murmurs Abdomen: Soft, active Extremities: Normal configuration Neurologic: Symmetric grossly intact.  Studies Reviewed:  CT scan of the neck with contrast performed earlier today shows a substantial mass replacing most of the LEFTsubmandibular gland.  no obvious invasion of surrounding structures.  No lymphadenopathy.  I will call and discuss this with him.  We will plan a LEFT submandibular gland excision intending adequate surgical margins.  We will talk with Dr. Rayann Heman about his pacemaker before scheduling his surgery.    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  We are preparing for your surgery next week.  I would prefer your beard to be shorter, but do not shave with a blade for 2 days before surgery.  I would like you to buy a small bottle of Hibiclens (chlorhexidine) surgical soap at the drug store   and shower with this including scrubbing your LEFT neck the night before surgery and again the morning of surgery.  No strenuous activities for 2 weeks after surgery.  I will see you here roughly 10 days after surgery to remove sutures.  Hydrocodone-Acetaminophen 5-325 MG Oral Tablet;1-2 po q4-6h prn pain; AUQ33; R0; Rx  Jodi Marble 3/54/5625, 3:11 PM

## 2015-02-28 ENCOUNTER — Encounter (HOSPITAL_COMMUNITY)
Admission: RE | Admit: 2015-02-28 | Discharge: 2015-02-28 | Disposition: A | Discharge status: Home / Self Care | Payer: Managed Care, Other (non HMO) | Source: Ambulatory Visit | Attending: Otolaryngology | Admitting: Otolaryngology

## 2015-02-28 ENCOUNTER — Other Ambulatory Visit: Payer: Self-pay

## 2015-02-28 ENCOUNTER — Encounter (HOSPITAL_COMMUNITY): Payer: Self-pay

## 2015-02-28 DIAGNOSIS — Z01818 Encounter for other preprocedural examination: Secondary | ICD-10-CM | POA: Insufficient documentation

## 2015-02-28 DIAGNOSIS — D379 Neoplasm of uncertain behavior of digestive organ, unspecified: Secondary | ICD-10-CM | POA: Insufficient documentation

## 2015-02-28 HISTORY — DX: Inflammatory liver disease, unspecified: K75.9

## 2015-02-28 LAB — COMPREHENSIVE METABOLIC PANEL
ALT: 41 U/L (ref 17–63)
AST: 26 U/L (ref 15–41)
Albumin: 4.4 g/dL (ref 3.5–5.0)
Alkaline Phosphatase: 53 U/L (ref 38–126)
Anion gap: 8 (ref 5–15)
BILIRUBIN TOTAL: 0.5 mg/dL (ref 0.3–1.2)
BUN: 16 mg/dL (ref 6–20)
CO2: 27 mmol/L (ref 22–32)
Calcium: 10.1 mg/dL (ref 8.9–10.3)
Chloride: 108 mmol/L (ref 101–111)
Creatinine, Ser: 1.05 mg/dL (ref 0.61–1.24)
GFR calc non Af Amer: >60 mL/min (ref >60)
Glucose, Bld: 99 mg/dL (ref 65–99)
POTASSIUM: 4.5 mmol/L (ref 3.5–5.1)
Sodium: 143 mmol/L (ref 135–145)
TOTAL PROTEIN: 7 g/dL (ref 6.5–8.1)

## 2015-02-28 LAB — CBC
HEMATOCRIT: 45.6 % (ref 39.0–52.0)
Hemoglobin: 15.6 g/dL (ref 13.0–17.0)
MCH: 28.6 pg (ref 26.0–34.0)
MCHC: 34.2 g/dL (ref 30.0–36.0)
MCV: 83.7 fL (ref 78.0–100.0)
Platelets: 198 10*3/uL (ref 150–400)
RBC: 5.45 MIL/uL (ref 4.22–5.81)
RDW: 13.5 % (ref 11.5–15.5)
WBC: 5.6 10*3/uL (ref 4.0–10.5)

## 2015-02-28 LAB — PROTIME-INR
INR: 1.07 (ref 0.00–1.49)
PROTHROMBIN TIME: 14.1 s (ref 11.6–15.2)

## 2015-02-28 NOTE — Pre-Procedure Instructions (Signed)
TRAMAR BRUECKNER  02/28/2015      [id number] [address],  - [place]  [address] Phone: [phone] Fax: [phone]  [id number] - [place], [place] [address] Phone: [phone] Fax: [phone]    Your procedure is scheduled on Wednesday 03/06/15  Report to [facility] Admitting at 630 A.M.  Call this number if you have problems the morning of surgery:  [phone]   Remember:  Do not eat food or drink liquids after midnight.  Take these medicines the morning of surgery with A SIP OF WATER     Oxycodone  If needed   Do not wear jewelry, make-up or nail polish.  Do not wear lotions, powders, or perfumes.  You may wear deodorant.  Do not shave 48 hours prior to surgery.  Men may shave face and neck.  Do not bring valuables to the hospital.  [facility] is not responsible for any belongings or valuables.  Contacts, dentures or bridgework may not be worn into surgery.  Leave your suitcase in the car.  After surgery it may be brought to your room.  For patients admitted to the hospital, discharge time will be determined by your treatment team.  Patients discharged the day of surgery will not be allowed to drive home.   Name and phone number of your driver:   Special instructions:  [facility] - Preparing for Surgery  Before surgery, you can play an important role.  Because skin is not sterile, your skin needs to be as free of germs as possible.  You can reduce the number of germs on you skin by washing with CHG (chlorahexidine gluconate) soap before surgery.  CHG is an antiseptic cleaner which kills germs and bonds with the skin to continue killing germs even after washing.  Please DO NOT use if you have an allergy to CHG or antibacterial soaps.  If your skin becomes reddened/irritated stop using the CHG and inform your nurse when you arrive at Short Stay.  Do  not shave (including legs and underarms) for at least 48 hours prior to the first CHG shower.  You may shave your face.  Please follow these instructions carefully:   1.  Shower with CHG Soap the night before surgery and the                                morning of Surgery.  2.  If you choose to wash your hair, wash your hair first as usual with your       normal shampoo.  3.  After you shampoo, rinse your hair and body thoroughly to remove the                      Shampoo.  4.  Use CHG as you would any other liquid soap.  You can apply chg directly       to the skin and wash gently with scrungie or a clean washcloth.  5.  Apply the CHG Soap to your body ONLY FROM THE NECK DOWN.        Do not use on open wounds or open sores.  Avoid contact with your eyes,       ears, mouth and genitals (private parts).  Wash genitals (private parts)       with  your normal soap.  6.  Wash thoroughly, paying special attention to the area where your surgery        will be performed.  7.  Thoroughly rinse your body with warm water from the neck down.  8.  DO NOT shower/wash with your normal soap after using and rinsing off       the CHG Soap.  9.  Pat yourself dry with a clean towel.            10.  Wear clean pajamas.            11.  Place clean sheets on your bed the night of your first shower and do not        sleep with pets.  Day of Surgery  Do not apply any lotions/deoderants the morning of surgery.  Please wear clean clothes to the hospital/surgery center.    Please read over the following fact sheets that you were given. Pain Booklet, Coughing and Deep Breathing and Surgical Site Infection Prevention

## 2015-03-05 MED ORDER — CEFAZOLIN SODIUM-DEXTROSE 2-3 GM-% IV SOLR
2.0000 g | INTRAVENOUS | Status: AC
  Administered 2015-03-06: 2 g via INTRAVENOUS
  Filled 2015-03-05: qty 50

## 2015-03-06 ENCOUNTER — Ambulatory Visit (HOSPITAL_COMMUNITY): Payer: Managed Care, Other (non HMO) | Admitting: Emergency Medicine

## 2015-03-06 ENCOUNTER — Ambulatory Visit (HOSPITAL_COMMUNITY)
Admission: RE | Admit: 2015-03-06 | Discharge: 2015-03-07 | Disposition: A | Discharge status: Home / Self Care | Payer: Managed Care, Other (non HMO) | Source: Ambulatory Visit | Attending: Otolaryngology | Admitting: Otolaryngology

## 2015-03-06 ENCOUNTER — Encounter (HOSPITAL_COMMUNITY): Payer: Self-pay | Admitting: *Deleted

## 2015-03-06 ENCOUNTER — Encounter (HOSPITAL_COMMUNITY)
Admission: RE | Disposition: A | Discharge status: Home / Self Care | Payer: Self-pay | Source: Ambulatory Visit | Attending: Otolaryngology

## 2015-03-06 ENCOUNTER — Ambulatory Visit (HOSPITAL_COMMUNITY): Payer: Managed Care, Other (non HMO) | Admitting: Certified Registered Nurse Anesthetist

## 2015-03-06 DIAGNOSIS — D490 Neoplasm of unspecified behavior of digestive system: Secondary | ICD-10-CM | POA: Diagnosis present

## 2015-03-06 DIAGNOSIS — Z950 Presence of cardiac pacemaker: Secondary | ICD-10-CM | POA: Insufficient documentation

## 2015-03-06 DIAGNOSIS — D117 Benign neoplasm of other major salivary glands: Secondary | ICD-10-CM | POA: Insufficient documentation

## 2015-03-06 DIAGNOSIS — I495 Sick sinus syndrome: Secondary | ICD-10-CM | POA: Diagnosis not present

## 2015-03-06 HISTORY — PX: SUBMANDIBULAR GLAND EXCISION: SHX2456

## 2015-03-06 SURGERY — EXCISION, SUBMANDIBULAR GLAND
Anesthesia: General | Laterality: Left

## 2015-03-06 MED ORDER — BACITRACIN ZINC 500 UNIT/GM EX OINT
TOPICAL_OINTMENT | CUTANEOUS | Status: AC
  Filled 2015-03-06: qty 28.35

## 2015-03-06 MED ORDER — LIDOCAINE HCL (CARDIAC) 20 MG/ML IV SOLN
INTRAVENOUS | Status: DC | PRN
  Administered 2015-03-06: 80 mg via INTRAVENOUS

## 2015-03-06 MED ORDER — ONDANSETRON HCL 4 MG/2ML IJ SOLN
INTRAMUSCULAR | Status: DC | PRN
  Administered 2015-03-06: 4 mg via INTRAVENOUS

## 2015-03-06 MED ORDER — DEXTROSE-NACL 5-0.45 % IV SOLN
INTRAVENOUS | Status: DC
  Administered 2015-03-06: 14:00:00 via INTRAVENOUS

## 2015-03-06 MED ORDER — HYDROCODONE-ACETAMINOPHEN 5-325 MG PO TABS
1.0000 | ORAL_TABLET | ORAL | Status: DC | PRN
  Administered 2015-03-06 – 2015-03-07 (×2): 2 via ORAL
  Filled 2015-03-06 (×2): qty 2

## 2015-03-06 MED ORDER — LACTATED RINGERS IV SOLN
INTRAVENOUS | Status: DC | PRN
  Administered 2015-03-06 (×2): via INTRAVENOUS

## 2015-03-06 MED ORDER — PHENYLEPHRINE 40 MCG/ML (10ML) SYRINGE FOR IV PUSH (FOR BLOOD PRESSURE SUPPORT)
PREFILLED_SYRINGE | INTRAVENOUS | Status: AC
  Filled 2015-03-06: qty 20

## 2015-03-06 MED ORDER — PROPOFOL 10 MG/ML IV BOLUS
INTRAVENOUS | Status: AC
  Filled 2015-03-06: qty 20

## 2015-03-06 MED ORDER — ONDANSETRON HCL 4 MG/2ML IJ SOLN
4.0000 mg | INTRAMUSCULAR | Status: DC | PRN
  Administered 2015-03-06 (×2): 4 mg via INTRAVENOUS
  Filled 2015-03-06 (×2): qty 2

## 2015-03-06 MED ORDER — FAMOTIDINE 20 MG PO TABS
20.0000 mg | ORAL_TABLET | Freq: Two times a day (BID) | ORAL | Status: DC | PRN
  Administered 2015-03-06: 20 mg via ORAL
  Filled 2015-03-06: qty 1

## 2015-03-06 MED ORDER — SUCCINYLCHOLINE CHLORIDE 20 MG/ML IJ SOLN
INTRAMUSCULAR | Status: AC
  Filled 2015-03-06: qty 1

## 2015-03-06 MED ORDER — MIDAZOLAM HCL 5 MG/5ML IJ SOLN
INTRAMUSCULAR | Status: DC | PRN
  Administered 2015-03-06: 2 mg via INTRAVENOUS

## 2015-03-06 MED ORDER — PHENYLEPHRINE HCL 10 MG/ML IJ SOLN
INTRAMUSCULAR | Status: DC | PRN
  Administered 2015-03-06 (×5): 80 ug via INTRAVENOUS
  Administered 2015-03-06: 40 ug via INTRAVENOUS
  Administered 2015-03-06: 80 ug via INTRAVENOUS

## 2015-03-06 MED ORDER — MIDAZOLAM HCL 2 MG/2ML IJ SOLN
INTRAMUSCULAR | Status: AC
  Filled 2015-03-06: qty 4

## 2015-03-06 MED ORDER — PROPOFOL 10 MG/ML IV BOLUS
INTRAVENOUS | Status: DC | PRN
  Administered 2015-03-06: 200 mg via INTRAVENOUS
  Administered 2015-03-06: 70 mg via INTRAVENOUS

## 2015-03-06 MED ORDER — HYDROCODONE-ACETAMINOPHEN 5-325 MG PO TABS: 1.0000 | ORAL_TABLET | Freq: Four times a day (QID) | ORAL | Status: DC | PRN

## 2015-03-06 MED ORDER — LIDOCAINE-EPINEPHRINE 1 %-1:100000 IJ SOLN
INTRAMUSCULAR | Status: AC
  Filled 2015-03-06: qty 1

## 2015-03-06 MED ORDER — IBUPROFEN 100 MG/5ML PO SUSP
400.0000 mg | Freq: Four times a day (QID) | ORAL | Status: DC | PRN
  Filled 2015-03-06: qty 20

## 2015-03-06 MED ORDER — FENTANYL CITRATE (PF) 250 MCG/5ML IJ SOLN
INTRAMUSCULAR | Status: AC
  Filled 2015-03-06: qty 5

## 2015-03-06 MED ORDER — LIDOCAINE HCL (CARDIAC) 20 MG/ML IV SOLN
INTRAVENOUS | Status: AC
  Filled 2015-03-06: qty 5

## 2015-03-06 MED ORDER — ONDANSETRON HCL 4 MG PO TABS
4.0000 mg | ORAL_TABLET | ORAL | Status: DC | PRN
  Administered 2015-03-06: 4 mg via ORAL
  Filled 2015-03-06: qty 1

## 2015-03-06 MED ORDER — PHENYLEPHRINE 40 MCG/ML (10ML) SYRINGE FOR IV PUSH (FOR BLOOD PRESSURE SUPPORT)
PREFILLED_SYRINGE | INTRAVENOUS | Status: AC
  Filled 2015-03-06: qty 10

## 2015-03-06 MED ORDER — ROCURONIUM BROMIDE 50 MG/5ML IV SOLN
INTRAVENOUS | Status: AC
  Filled 2015-03-06: qty 1

## 2015-03-06 MED ORDER — SUCCINYLCHOLINE CHLORIDE 200 MG/10ML IV SOSY
PREFILLED_SYRINGE | INTRAVENOUS | Status: DC | PRN
  Administered 2015-03-06: 120 mg via INTRAVENOUS

## 2015-03-06 MED ORDER — LIDOCAINE-EPINEPHRINE 1 %-1:100000 IJ SOLN
INTRAMUSCULAR | Status: DC | PRN
  Administered 2015-03-06: 5 mL

## 2015-03-06 MED ORDER — DEXAMETHASONE SODIUM PHOSPHATE 10 MG/ML IJ SOLN
INTRAMUSCULAR | Status: AC
  Filled 2015-03-06: qty 1

## 2015-03-06 MED ORDER — HYDROMORPHONE HCL 1 MG/ML IJ SOLN
0.2500 mg | INTRAMUSCULAR | Status: DC | PRN
  Administered 2015-03-06 (×2): 0.5 mg via INTRAVENOUS

## 2015-03-06 MED ORDER — FENTANYL CITRATE (PF) 100 MCG/2ML IJ SOLN
INTRAMUSCULAR | Status: DC | PRN
  Administered 2015-03-06: 100 ug via INTRAVENOUS
  Administered 2015-03-06 (×2): 50 ug via INTRAVENOUS
  Administered 2015-03-06: 25 ug via INTRAVENOUS
  Administered 2015-03-06: 50 ug via INTRAVENOUS
  Administered 2015-03-06: 25 ug via INTRAVENOUS
  Administered 2015-03-06 (×2): 50 ug via INTRAVENOUS

## 2015-03-06 MED ORDER — DEXAMETHASONE SODIUM PHOSPHATE 10 MG/ML IJ SOLN
INTRAMUSCULAR | Status: DC | PRN
  Administered 2015-03-06: 10 mg via INTRAVENOUS

## 2015-03-06 MED ORDER — PROMETHAZINE HCL 25 MG/ML IJ SOLN
6.2500 mg | INTRAMUSCULAR | Status: DC | PRN
Start: 2015-03-06 — End: 2015-03-06

## 2015-03-06 MED ORDER — MORPHINE SULFATE (PF) 2 MG/ML IV SOLN: 1.0000 mg | INTRAVENOUS | Status: DC | PRN

## 2015-03-06 MED ORDER — ONDANSETRON HCL 4 MG/2ML IJ SOLN
INTRAMUSCULAR | Status: AC
  Filled 2015-03-06: qty 2

## 2015-03-06 MED ORDER — 0.9 % SODIUM CHLORIDE (POUR BTL) OPTIME
TOPICAL | Status: DC | PRN
  Administered 2015-03-06: 1000 mL

## 2015-03-06 MED ORDER — HYDROMORPHONE HCL 1 MG/ML IJ SOLN
INTRAMUSCULAR | Status: AC
  Administered 2015-03-06: 14:00:00
  Filled 2015-03-06: qty 1

## 2015-03-06 SURGICAL SUPPLY — 50 items
ATTRACTOMAT 16X20 MAGNETIC DRP (DRAPES) IMPLANT
BLADE SURG 15 STRL LF DISP TIS (BLADE) IMPLANT
BLADE SURG 15 STRL SS (BLADE)
BLADE SURG ROTATE 9660 (MISCELLANEOUS) IMPLANT
CANISTER SUCTION 2500CC (MISCELLANEOUS) ×2 IMPLANT
CLEANER TIP ELECTROSURG 2X2 (MISCELLANEOUS) ×2 IMPLANT
CONT SPEC 4OZ CLIKSEAL STRL BL (MISCELLANEOUS) ×2 IMPLANT
CORDS BIPOLAR (ELECTRODE) ×2 IMPLANT
COVER SURGICAL LIGHT HANDLE (MISCELLANEOUS) ×2 IMPLANT
CRADLE DONUT ADULT HEAD (MISCELLANEOUS) ×2 IMPLANT
DRAIN SNY 10 ROU (WOUND CARE) ×2 IMPLANT
DRAPE PROXIMA HALF (DRAPES) IMPLANT
ELECT COATED BLADE 2.86 ST (ELECTRODE) ×2 IMPLANT
ELECT PAIRED SUBDERMAL (MISCELLANEOUS) ×2
ELECT REM PT RETURN 9FT ADLT (ELECTROSURGICAL) ×2
ELECTRODE PAIRED SUBDERMAL (MISCELLANEOUS) ×1 IMPLANT
ELECTRODE REM PT RTRN 9FT ADLT (ELECTROSURGICAL) ×1 IMPLANT
EVACUATOR SILICONE 100CC (DRAIN) ×2 IMPLANT
GAUZE SPONGE 4X4 16PLY XRAY LF (GAUZE/BANDAGES/DRESSINGS) ×2 IMPLANT
GLOVE BIOGEL PI IND STRL 7.0 (GLOVE) ×1 IMPLANT
GLOVE BIOGEL PI IND STRL 7.5 (GLOVE) ×1 IMPLANT
GLOVE BIOGEL PI INDICATOR 7.0 (GLOVE) ×1
GLOVE BIOGEL PI INDICATOR 7.5 (GLOVE) ×1
GLOVE ECLIPSE 6.5 STRL STRAW (GLOVE) ×2 IMPLANT
GLOVE ECLIPSE 8.0 STRL XLNG CF (GLOVE) ×2 IMPLANT
GOWN STRL REUS W/ TWL LRG LVL3 (GOWN DISPOSABLE) ×2 IMPLANT
GOWN STRL REUS W/ TWL XL LVL3 (GOWN DISPOSABLE) ×1 IMPLANT
GOWN STRL REUS W/TWL LRG LVL3 (GOWN DISPOSABLE) ×2
GOWN STRL REUS W/TWL XL LVL3 (GOWN DISPOSABLE) ×1
KIT BASIN OR (CUSTOM PROCEDURE TRAY) ×2 IMPLANT
KIT ROOM TURNOVER OR (KITS) ×2 IMPLANT
LIQUID BAND (GAUZE/BANDAGES/DRESSINGS) ×2 IMPLANT
LOCATOR NERVE 3 VOLT (DISPOSABLE) IMPLANT
NEEDLE HYPO 25GX1X1/2 BEV (NEEDLE) ×2 IMPLANT
NS IRRIG 1000ML POUR BTL (IV SOLUTION) ×2 IMPLANT
PAD ARMBOARD 7.5X6 YLW CONV (MISCELLANEOUS) ×2 IMPLANT
PENCIL BUTTON HOLSTER BLD 10FT (ELECTRODE) ×2 IMPLANT
PROBE NERVBE PRASS .33 (MISCELLANEOUS) ×2 IMPLANT
STAPLER VISISTAT 35W (STAPLE) ×2 IMPLANT
SUT CHROMIC 4 0 PS 2 18 (SUTURE) ×4 IMPLANT
SUT ETHILON 3 0 PS 1 (SUTURE) ×4 IMPLANT
SUT ETHILON 5 0 PS 2 18 (SUTURE) IMPLANT
SUT SILK 0 TIES 10X30 (SUTURE) IMPLANT
SUT SILK 3 0 REEL (SUTURE) ×4 IMPLANT
SUT SILK 4 0 REEL (SUTURE) IMPLANT
SYR CONTROL 10ML LL (SYRINGE) ×2 IMPLANT
TOWEL OR 17X24 6PK STRL BLUE (TOWEL DISPOSABLE) ×2 IMPLANT
TRAY ENT MC OR (CUSTOM PROCEDURE TRAY) ×2 IMPLANT
TUBE CONNECTING 12X1/4 (SUCTIONS) IMPLANT
TUBE ENDOTRAC EMG 7X10.2 (MISCELLANEOUS) IMPLANT

## 2015-03-06 NOTE — Interval H&P Note (Signed)
History and Physical Interval Note:  03/06/2015 8:28 AM  Chase Parker  has presented today for surgery, with the diagnosis of LEFT SUBMANDIBULAR MASS  The various methods of treatment have been discussed with the patient and family. After consideration of risks, benefits and other options for treatment, the patient has consented to  Procedure(s): EXCISION  LEFT SUBMANDIBULAR GLAND (Left) as a surgical intervention .  The patient's history has been re-reviewed, patient re-examined, no change in status, stable for surgery.  I have re-reviewed the patient's chart and labs.  Questions were answered to the patient's satisfaction.     Jodi Marble

## 2015-03-06 NOTE — H&P (View-Only) (Signed)
Chase Parker, Chase Parker 34 y.o., male [id number]     Chief Complaint: LEFT submandibular tumor  HPI: 34 year old white male comes in for evaluation of LEFT upper neck mass.  It has been present and growing slowly for perhaps as much as 5 years.  No pain.  No difficulty speaking or swallowing.  It does not swell with meals.  No external drainage or suggestion of infection at any point.  No prior  evaluation.  No fevers or night sweats.  No change in weight, appetite, or energy.  No masses elsewhere in his body including axillae and groins.   He has a history of sick sinus syndrome for which he has a pacemaker.  To his understanding, he paces  less than 10% of beats.  we received the fine needle aspiration cytology report of the LEFT submandibular gland.  I spoke with Dr. Saralyn Pilar, pathologist, about this.  the specimen shows neoplasm.  Dr. Saralyn Pilar favors benign but not completely certain.  We have a CT scan pending.  This will need a surgical removal with some margin.  I will call and discuss this with him.  I will also call him with the CT results.  Preoperative visit prior to excision, LEFT submandibular tumor.  I discussed the surgery with him in detail including risks and complications.  We discussed the options related to a benign versus malignant diagnosis.  I discussed advancement of diet and activity after surgery.  We will see him back 10 days after surgery for suture removal.  I gave him a prescription for hydrocodone 5/325 for pain relief afterwards.  he may also use ibuprofen.   he did receive a second opinion from Dr. Camila Li at [facility].   He has seen a cardiologist recently and had his pacemaker interrogated.  It is functioning adequately. Amended Jodi Marble  M.D.; 78/29/5621 2:43 PM EST.  PMH: Past Medical History  Diagnosis Date  . Sick sinus syndrome     s/p PPM implant in [place]  . GERD (gastroesophageal reflux disease)   . 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 Ignacia Bayley  . Mass of left submandibular region 12/2014    CT scan of neck performed on 01/16/2015, showed no obvious invasion of surrounding structure, no lymphadenopathy, plan: left submandibular gland exicision intending adequate surgical margins  . 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 Neoplasm of submandibular gland     Dr. Vicie Mutters at [facility], recommends removal and node dissection    Surg Hx: Past Surgical History  Procedure Laterality Date  . Pacemaker insertion  05/06/2005    SJM implanted at [facility] in [place]    FHx:   Family History  Problem Relation Age of Onset  . Diabetes Other     father side   . CAD Neg Hx   . Colon cancer Neg Hx   . Prostate cancer Neg Hx   . Breast cancer Other     aunts  . Ovarian cancer Mother    SocHx:  reports that he has never smoked. He does not have any smokeless tobacco history on file. He reports that he does not drink alcohol or use illicit drugs.  ALLERGIES: No Known Allergies   (Not in a hospital admission)  No results found for this or any previous visit (from the past 48 hour(s)). No results found.  HYQ:MVHQIONG: Not feeling tired (fatigue).  No fever, no night sweats, and no  recent weight loss. Head: No headache. Eyes: No eye symptoms. Otolaryngeal: No hearing loss, no earache, no tinnitus, and no purulent nasal discharge.  No nasal passage blockage (stuffiness), no snoring, no sneezing, no hoarseness, and no sore throat. Cardiovascular: No chest pain or discomfort  and no palpitations. Pulmonary: No dyspnea, no cough, and no wheezing. Gastrointestinal: No dysphagia  and no heartburn.  No nausea, no abdominal pain, and no melena.  No diarrhea. Genitourinary: No dysuria. Endocrine: No muscle weakness. Musculoskeletal: No calf muscle cramps, no arthralgias, and no soft tissue swelling. Neurological: No dizziness, no fainting, no tingling, and no  numbness. Psychological: No anxiety  and no depression. Skin: No rash.  BP:114/76,  HR: 60 b/min,  BSA Calculated: 2.00 ,  BMI Calculated: 32.12 ,  Weight: 199 lb , BMI: 32.1 kg/m2,  Height: 5 ft 6 in.   PHYSICAL EXAM: This is a muscular, stocky, and healthy-appearing adult white male.  Mental status is sharp.  He appears well in conversational speech.  Voice is clear and respirations unlabored through the nose.  The head is atraumatic and neck supple.  Cranial nerves intact.  Ear canals are clear with normal drums.  Anterior nose is moist and patent.  Oral cavity is moist with teeth in good repair.  He has full normal tongue mobility.  Oropharynx is clear with normal soft palate and small tonsils.  Neck examination is remarkable for a roughly 4 cm firm mass in the LEFT submandibular gland.  No adenopathy.  He is somewhat stocky but healthy appearing.  Mental status is appropriate.  He hears well in conversational speech.  Voice is clear and respirations unlabored through the nose.  The head is atraumatic and neck supple.  Ear canals are clear with normal drums.  Anterior nose is moist and patent.  Oral cavity is clear with teeth in good repair.  Oropharynx clear.  Neck with a 3 cm LEFT submandibular firm but not rock hard fullness.  Ramus mandibularis is intact both sides.   Lungs: Clear to auscultation Heart: Regular rate and rhythm without murmurs Abdomen: Soft, active Extremities: Normal configuration Neurologic: Symmetric grossly intact.  Studies Reviewed:  CT scan of the neck with contrast performed earlier today shows a substantial mass replacing most of the LEFTsubmandibular gland.  no obvious invasion of surrounding structures.  No lymphadenopathy.  I will call and discuss this with him.  We will plan a LEFT submandibular gland excision intending adequate surgical margins.  We will talk with Dr. Rayann Heman about his pacemaker before scheduling his surgery.    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  We are preparing for your surgery next week.  I would prefer your beard to be shorter, but do not shave with a blade for 2 days before surgery.  I would like you to buy a small bottle of Hibiclens (chlorhexidine) surgical soap at the drug store   and shower with this including scrubbing your LEFT neck the night before surgery and again the morning of surgery.  No strenuous activities for 2 weeks after surgery.  I will see you here roughly 10 days after surgery to remove sutures.  Hydrocodone-Acetaminophen 5-325 MG Oral Tablet;1-2 po q4-6h prn pain; XIP38; R0; Rx  Jodi Marble 2/50/5397, 3:11 PM

## 2015-03-06 NOTE — Discharge Instructions (Signed)
Keep head elevated 3-4 nights No heavy strenuous activity x 2 weeks OK to shower beginning Thursday, 6 OCT Do NOT use ointment on the surgical glue.  You can peel up the loose edges of the glue as they work loose next week. Call for signs of bleeding or infection. [place] my office 10 days please. [phone] for an appointment

## 2015-03-06 NOTE — Anesthesia Procedure Notes (Signed)
Procedure Name: Intubation Date/Time: 03/06/2015 8:40 AM Performed by: Salli Quarry Patria Warzecha Pre-anesthesia Checklist: Patient identified, Emergency Drugs available, Patient being monitored and Suction available Patient Re-evaluated:Patient Re-evaluated prior to inductionOxygen Delivery Method: Circle system utilized Preoxygenation: Pre-oxygenation with 100% oxygen Intubation Type: IV induction Ventilation: Mask ventilation without difficulty Laryngoscope Size: Mac and 3 Grade View: Grade I Tube type: Oral Tube size: 7.0 mm Number of attempts: 1 Airway Equipment and Method: Stylet Placement Confirmation: ETT inserted through vocal cords under direct vision,  positive ETCO2 and breath sounds checked- equal and bilateral Secured at: 22 cm Tube secured with: Tape Dental Injury: Teeth and Oropharynx as per pre-operative assessment

## 2015-03-06 NOTE — Anesthesia Postprocedure Evaluation (Signed)
  Anesthesia Post-op Note  Patient: Chase Parker  Procedure(s) Performed: Procedure(s): EXCISION  LEFT SUBMANDIBULAR GLAND (Left)  Patient Location: PACU  Anesthesia Type:General  Level of Consciousness: awake, alert  and sedated  Airway and Oxygen Therapy: Patient Spontanous Breathing  Post-op Pain: mild  Post-op Assessment: Post-op Vital signs reviewed              Post-op Vital Signs: stable  Last Vitals:  Filed Vitals:   03/06/15 1239  BP: 122/72  Pulse: 64  Temp: 36.1 C  Resp: 18    Complications: No apparent anesthesia complications

## 2015-03-06 NOTE — Op Note (Signed)
03/06/2015  11:16 AM    Alonza Smoker  [id number]   Pre-Op Dx:  Left submandibular neoplasm  Post-op Dx: Same  Proc: Excision, left submandibular gland   Surg:  Jodi Marble T MD  Assistant:  Sallee Provencal PA  Anes:  GOT  EBL:  Minimal  Comp:  None  Findings:  A bulky tumor replacing approximately two thirds of the left submandibular gland with slight fibrosis to the overlying platysma muscle. No identified adenopathy. Ramus mandibularis, hypoglossal, and lingual nerves identified and preserved.  Procedure: With the patient in a comfortable supine position, general orotracheal anesthesia was induced without difficulty. At an appropriate level, the patient was placed in a slight reverse Trendelenburg with the head rotated to the right for access to the left neck. A shoulder roll was applied. The nerve integrity monitor was applied to the corner of the mouth only. The proposed incisional site was infiltrated with 5 mL's of 1% Xylocaine with 1 100,000 epinephrine. A sterile preparation and draping of the left neck was accomplished in the standard fashion.  The identifying initial was noted. The neck was palpated with the findings as described above. A 7 cm transverse incision was made in a relaxed skin tension line and carried down through skin, subcutaneous fat, and a relatively bulky platysma muscle. Staying on the medial surface of the platysma, a superior flap was developed. Upon encountering some fibrosis, a layer of muscle was allowed to remain with the gland. Using the nerve stimulator, branches of the ramus mandibularis were identified and carefully dissected and finally preserved away from the surgical field. Small bit of soft tissue were the facial artery and vein cross the mandible possibly containing lymph nodes was dissected and the vessels were controlled with silk ligatures and carried downward under the ramus mandibularis.  The gland was dissected off the horizontal ramus of  the mandible. Working bluntly at this posterior inferior pole the gland was dissected. Working anteriorly well away from the tumor the gland was dissected down to the anterior belly of the digastric. Working along the digastric down towards the omohyoid, this soft tissues were divided and carried upward with the gland. The specimen was rolled off the tendon of the digastric muscle and deep to this, by blunt dissection, the hypoglossal nerve was identified and protected. Working posteriorly, additional branches of the facial artery and vein were controlled with silk ligature and divided. Rolling the gland forward, the lingual nerve was identified. The submandibular ganglion was controlled with silk ligature.  Working along the anterior belly of the digastric, soft tissue with possible ranine vein branches was controlled with ligature. The gland was dissected from the mylohyoid muscle. The hypoglossal and lingual nerves were protected. The duct was crossclamped and divided and controlled with silk ligature. The specimen was sent off for permanent pathologic interpretation.  The wound was thoroughly irrigated. Valsalva did not reveal any bleeding sites. A 10 French perforated drain was passed through a separate puncture posteriorly and laid into the wound bed and secured to the skin with a 3-0 nylon stitch.  The platysma muscle and subcutaneous tissues were reapproximated with 4-0 chromic gut. The drain was connected to suction and observed to be functioning properly. The skin was closed with Dermabond glue in a cosmetic fashion.  At this point the procedure was completed. The patient was returned to anesthesia, awakened, extubated, and transferred to recovery in stable condition.    Dispo:   PACU to 23 hour observation  Plan:  Ice, elevation,  analgesia, suction drainage. We'll remove the suction drain in the morning and discharge to home in care of family. We will see him back in the office 10 days for  evaluation of the wound and review of pathology.  Tyson Alias MD

## 2015-03-06 NOTE — Progress Notes (Signed)
03/06/2015 5:59 PM  Chase Parker [id number]  Post-Op Check   Temp:  [97 F (36.1 C)-98.2 F (36.8 C)] 98.1 F (36.7 C) (10/05 1355) Pulse Rate:  [64-84] 69 (10/05 1355) Resp:  [7-20] 16 (10/05 1341) BP: (117-133)/(60-83) 117/72 mmHg (10/05 1355) SpO2:  [96 %-100 %] 96 % (10/05 1355) Weight:  [91.627 kg (202 lb)-91.882 kg (202 lb 9 oz)] 91.627 kg (202 lb) (10/05 0715),     Intake/Output Summary (Last 24 hours) at 03/06/15 1759 Last data filed at 03/06/15 1746  Gross per 24 hour  Intake   1460 ml  Output    490 ml  Net    970 ml   JP drain 15 ml  No results found for this or any previous visit (from the past 24 hour(s)).  SUBJECTIVE:  Mild pain.  N, V earlier.  Taking good po liquids. Spont void.  Breathing well.  No chest pain.  OBJECTIVE:  LEFT r mandibularis weak.  Hypoglossal OK.  Wound flat.  Drain functional  IMPRESSION:  Satisfactory check  PLAN:  D/C IV.  If doing well, remove drain and discharge home in AM.    [place], Trainer

## 2015-03-06 NOTE — Transfer of Care (Signed)
Immediate Anesthesia Transfer of Care Note  Patient: Chase Parker  Procedure(s) Performed: Procedure(s): EXCISION  LEFT SUBMANDIBULAR GLAND (Left)  Patient Location: PACU  Anesthesia Type:General  Level of Consciousness: sedated and responds to stimulation  Airway & Oxygen Therapy: Patient Spontanous Breathing and Patient connected to nasal cannula oxygen  Post-op Assessment: Report given to RN and Post -op Vital signs reviewed and stable  Post vital signs: Reviewed and stable  Last Vitals:  Filed Vitals:   03/06/15 1123  BP:   Pulse:   Temp: 36.8 C  Resp:     Complications: No apparent anesthesia complications

## 2015-03-06 NOTE — Anesthesia Preprocedure Evaluation (Addendum)
Anesthesia Evaluation  Patient identified by MRN, date of birth, ID band Patient awake    Reviewed: Allergy & Precautions, NPO status , Patient's Chart, lab work & pertinent test results  History of Anesthesia Complications Negative for: history of anesthetic complications  Airway Mallampati: I  TM Distance: >3 FB Neck ROM: Full    Dental  (+) Teeth Intact, Dental Advisory Given   Pulmonary neg pulmonary ROS,    breath sounds clear to auscultation       Cardiovascular + pacemaker  Rhythm:Regular Rate:Normal  Hx of Sick sinus   Neuro/Psych negative neurological ROS     GI/Hepatic GERD  ,(+) Hepatitis -  Endo/Other  negative endocrine ROS  Renal/GU      Musculoskeletal negative musculoskeletal ROS (+)   Abdominal   Peds  Hematology negative hematology ROS (+)   Anesthesia Other Findings   Reproductive/Obstetrics                           Anesthesia Physical Anesthesia Plan  ASA: III  Anesthesia Plan: General   Post-op Pain Management:    Induction: Intravenous  Airway Management Planned: Oral ETT  Additional Equipment:   Intra-op Plan:   Post-operative Plan: Extubation in OR  Informed Consent: I have reviewed the patients History and Physical, chart, labs and discussed the procedure including the risks, benefits and alternatives for the proposed anesthesia with the patient or authorized representative who has indicated his/her understanding and acceptance.   Dental advisory given  Plan Discussed with: CRNA and Surgeon  Anesthesia Plan Comments:         Anesthesia Quick Evaluation

## 2015-03-07 ENCOUNTER — Encounter (HOSPITAL_COMMUNITY): Payer: Self-pay | Admitting: Otolaryngology

## 2015-03-07 DIAGNOSIS — D117 Benign neoplasm of other major salivary glands: Secondary | ICD-10-CM | POA: Diagnosis not present

## 2015-03-07 NOTE — Progress Notes (Signed)
AVS given to patient, IV removed, Drain removed, Incision intact clean & dry.  Belongings packed and transportation arranged per patient.  Patient demonstrated understanding of discharge instructions.

## 2015-03-07 NOTE — Discharge Summary (Signed)
  03/07/2015 11:59 AM  Alonza Smoker [id number]  Post-Op Day 1    Temp:  [97 F (36.1 C)-98.1 F (36.7 C)] 97.8 F (36.6 C) (10/06 0600) Pulse Rate:  [64-85] 85 (10/06 0600) Resp:  [8-20] 18 (10/06 0600) BP: (102-140)/(64-86) 114/69 mmHg (10/06 0600) SpO2:  [94 %-100 %] 97 % (10/06 0600),     Intake/Output Summary (Last 24 hours) at 03/07/15 1159 Last data filed at 03/07/15 0859  Gross per 24 hour  Intake    940 ml  Output   1620 ml  Net   -680 ml   Drain 35 ml, 10 ml last shift  No results found for this or any previous visit (from the past 24 hour(s)).  SUBJECTIVE:  Min pain.  Sl Nausea.  Has not been out of bed yet.  Breathing, swallowing, voiding without difficulty  OBJECTIVE:  Neck wound flat. CN XII intact.  Drain removed  IMPRESSION:  Satisfactory check  PLAN:  Discharge home.    Admit:  5 OCT Discharge: 6 OCT Final Diagnosis:  LEFT submandibular neoplasm Proc:  LEFT submandibular gland excision, 5 OCT Comp: none Cond: pain controlled.  Eating, breathing, voiding without difficulty Rx:  Hydrocodone Recheck: 10 days Instructions written and given  Hosp Course:  Underwent surgery and then observed 23 hr post op.  Min pain.  Tol po food and liquids.  Spont void.  Min drainage.  Drain discontinued on AM of POD 1.  Pt discharged to home and care of family.  Jodi Marble

## 2015-06-05 ENCOUNTER — Telehealth: Payer: Self-pay | Admitting: Internal Medicine

## 2015-06-05 NOTE — Telephone Encounter (Signed)
yes

## 2015-06-05 NOTE — Telephone Encounter (Signed)
Patient is requesting to transfer from [facility] to [facility].  Please advise.  °

## 2015-06-05 NOTE — Telephone Encounter (Signed)
Yes please

## 2015-06-10 NOTE — Telephone Encounter (Signed)
Left vm for patient to call back to schedule appt with Ronnald Ramp

## 2015-07-08 ENCOUNTER — Encounter: Payer: Self-pay | Admitting: Internal Medicine

## 2015-07-08 ENCOUNTER — Ambulatory Visit (INDEPENDENT_AMBULATORY_CARE_PROVIDER_SITE_OTHER): Payer: Managed Care, Other (non HMO) | Admitting: Internal Medicine

## 2015-07-08 VITALS — BP 108/70 | HR 88 | Temp 98.3°F | Resp 16 | Ht 66.75 in | Wt 208.0 lb

## 2015-07-08 DIAGNOSIS — Z7251 High risk heterosexual behavior: Secondary | ICD-10-CM | POA: Diagnosis not present

## 2015-07-08 DIAGNOSIS — Z0000 Encounter for general adult medical examination without abnormal findings: Secondary | ICD-10-CM | POA: Diagnosis not present

## 2015-07-08 MED ORDER — EMTRICITABINE-TENOFOVIR DF 200-300 MG PO TABS: 1.0000 | ORAL_TABLET | Freq: Every day | ORAL | Status: DC

## 2015-07-08 NOTE — Patient Instructions (Signed)
Safe Sex  Safe sex is about reducing the risk of giving or getting a sexually transmitted disease (STD). STDs are spread through sexual contact involving the genitals, mouth, or rectum. Some STDs can be cured and others cannot. Safe sex can also prevent unintended pregnancies.   WHAT ARE SOME SAFE SEX PRACTICES?  · Limit your sexual activity to only one partner who is having sex with only you.  · Talk to your partner about his or her past partners, past STDs, and drug use.  · Use a condom every time you have sexual intercourse. This includes vaginal, oral, and anal sexual activity. Both females and males should wear condoms during oral sex. Only use latex or polyurethane condoms and water-based lubricants. Using petroleum-based lubricants or oils to lubricate a condom will weaken the condom and increase the chance that it will break. The condom should be in place from the beginning to the end of sexual activity. Wearing a condom reduces, but does not completely eliminate, your risk of getting or giving an STD. STDs can be spread by contact with infected body fluids and skin.  · Get vaccinated for hepatitis B and HPV.  · Avoid alcohol and recreational drugs, which can affect your judgment. You may forget to use a condom or participate in high-risk sex.  · For females, avoid douching after sexual intercourse. Douching can spread an infection farther into the reproductive tract.  · Check your body for signs of sores, blisters, rashes, or unusual discharge. See your health care provider if you notice any of these signs.  · Avoid sexual contact if you have symptoms of an infection or are being treated for an STD. If you or your partner has herpes, avoid sexual contact when blisters are present. Use condoms at all other times.  · If you are at risk of being infected with HIV, it is recommended that you take a prescription medicine daily to prevent HIV infection. This is called pre-exposure prophylaxis (PrEP). You are  considered at risk if:    You are a man who has sex with other men (MSM).    You are a heterosexual man or woman who is sexually active with more than one partner.    You take drugs by injection.    You are sexually active with a partner who has HIV.  · Talk with your health care provider about whether you are at high risk of being infected with HIV. If you choose to begin PrEP, you should first be tested for HIV. You should then be tested every 3 months for as long as you are taking PrEP.  · See your health care provider for regular screenings, exams, and tests for other STDs. Before having sex with a new partner, each of you should be screened for STDs and should talk about the results with each other.  WHAT ARE THE BENEFITS OF SAFE SEX?   · There is less chance of getting or giving an STD.  · You can prevent unwanted or unintended pregnancies.  · By discussing safe sex concerns with your partner, you may increase feelings of intimacy, comfort, trust, and honesty between the two of you.     This information is not intended to replace advice given to you by your health care provider. Make sure you discuss any questions you have with your health care provider.     Document Released: 06/25/2004 Document Revised: 06/08/2014 Document Reviewed: 11/09/2011  Elsevier Interactive Patient Education ©2016 [employer]

## 2015-07-08 NOTE — Progress Notes (Signed)
Pre visit review using our clinic review tool, if applicable. No additional management support is needed unless otherwise documented below in the visit note. 

## 2015-07-10 ENCOUNTER — Other Ambulatory Visit (INDEPENDENT_AMBULATORY_CARE_PROVIDER_SITE_OTHER): Payer: Managed Care, Other (non HMO)

## 2015-07-10 DIAGNOSIS — Z0000 Encounter for general adult medical examination without abnormal findings: Secondary | ICD-10-CM

## 2015-07-10 LAB — LIPID PANEL
CHOL/HDL RATIO: 4
Cholesterol: 165 mg/dL (ref 0–200)
HDL: 36.8 mg/dL — AB (ref >39.00)
LDL Cholesterol: 103 mg/dL — ABNORMAL HIGH (ref 0–99)
NonHDL: 128.17
TRIGLYCERIDES: 126 mg/dL (ref 0.0–149.0)
VLDL: 25.2 mg/dL (ref 0.0–40.0)

## 2015-07-10 LAB — CBC WITH DIFFERENTIAL/PLATELET
Basophils Absolute: 0 10*3/uL (ref 0.0–0.1)
Basophils Relative: 0.4 % (ref 0.0–3.0)
EOS ABS: 0.3 10*3/uL (ref 0.0–0.7)
Eosinophils Relative: 4.2 % (ref 0.0–5.0)
HEMATOCRIT: 46 % (ref 39.0–52.0)
Hemoglobin: 15.4 g/dL (ref 13.0–17.0)
LYMPHS PCT: 29.2 % (ref 12.0–46.0)
Lymphs Abs: 1.8 10*3/uL (ref 0.7–4.0)
MCHC: 33.5 g/dL (ref 30.0–36.0)
MCV: 83.2 fl (ref 78.0–100.0)
MONOS PCT: 11.4 % (ref 3.0–12.0)
Monocytes Absolute: 0.7 10*3/uL (ref 0.1–1.0)
NEUTROS ABS: 3.3 10*3/uL (ref 1.4–7.7)
Neutrophils Relative %: 54.8 % (ref 43.0–77.0)
PLATELETS: 211 10*3/uL (ref 150.0–400.0)
RBC: 5.53 Mil/uL (ref 4.22–5.81)
RDW: 13.6 % (ref 11.5–15.5)
WBC: 6 10*3/uL (ref 4.0–10.5)

## 2015-07-10 LAB — URINALYSIS, ROUTINE W REFLEX MICROSCOPIC
BILIRUBIN URINE: NEGATIVE
HGB URINE DIPSTICK: NEGATIVE
KETONES UR: NEGATIVE
LEUKOCYTES UA: NEGATIVE
NITRITE: NEGATIVE
RBC / HPF: NONE SEEN (ref 0–?)
Specific Gravity, Urine: 1.02 (ref 1.000–1.030)
Total Protein, Urine: NEGATIVE
UROBILINOGEN UA: 0.2 (ref 0.0–1.0)
Urine Glucose: NEGATIVE
pH: 6 (ref 5.0–8.0)

## 2015-07-10 LAB — COMPREHENSIVE METABOLIC PANEL
ALK PHOS: 59 U/L (ref 39–117)
ALT: 56 U/L — AB (ref 0–53)
AST: 82 U/L — AB (ref 0–37)
Albumin: 4.5 g/dL (ref 3.5–5.2)
BILIRUBIN TOTAL: 0.7 mg/dL (ref 0.2–1.2)
BUN: 18 mg/dL (ref 6–23)
CALCIUM: 9.8 mg/dL (ref 8.4–10.5)
CO2: 27 meq/L (ref 19–32)
CREATININE: 1.09 mg/dL (ref 0.40–1.50)
Chloride: 104 mEq/L (ref 96–112)
GFR: 82.34 mL/min (ref >60.00)
GLUCOSE: 95 mg/dL (ref 70–99)
Potassium: 4.5 mEq/L (ref 3.5–5.1)
Sodium: 140 mEq/L (ref 135–145)
TOTAL PROTEIN: 7.6 g/dL (ref 6.0–8.3)

## 2015-07-10 LAB — TSH: TSH: 1.07 u[IU]/mL (ref 0.35–4.50)

## 2015-07-11 ENCOUNTER — Encounter: Payer: Self-pay | Admitting: Internal Medicine

## 2015-07-11 LAB — HIV ANTIBODY (ROUTINE TESTING W REFLEX): HIV 1&2 Ab, 4th Generation: NONREACTIVE

## 2015-07-11 LAB — RPR

## 2015-07-11 NOTE — Progress Notes (Signed)
Subjective:  Patient ID: Chase Parker, male    DOB: 11-08-1980  Age: 35 y.o. MRN: [id number]  CC: Annual Exam   HPI NICODEMUS SALMI presents for a complete physical. He wants to start taking Truvada. He tells me that he has over 30 sexual partners per year and does not always use a condom. He has never had any sexually transmitted infections before and he has no suspicious symptoms at this time.  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   He has past surgical history that includes Pacemaker insertion (05/06/2005); Leg Surgery; Submandibular gland excision (Left, 03/06/2015); and Submandibular gland excision (Left, 03/06/2015).   His family history includes Breast cancer in his other; Diabetes in his other; Ovarian cancer in his mother. There is no history of CAD, Colon cancer, or Prostate cancer.He reports that he has never smoked. He has never used smokeless tobacco. He reports that he does not drink alcohol or use illicit drugs.  Outpatient Prescriptions Prior to Visit  Medication Sig Dispense Refill  . sildenafil (VIAGRA) 100 MG tablet Take 0.5-1 tablets (50-100 mg total) by mouth daily as needed for erectile dysfunction. 3 tablet 6  . HYDROcodone-acetaminophen (NORCO/VICODIN) 5-325 MG tablet Take 1-2 tablets by mouth every 6 (six) hours as needed for moderate pain.     No facility-administered medications prior to visit.    ROS Review of Systems  Constitutional: Negative.  Negative for fever, chills, diaphoresis, appetite change and fatigue.  HENT: Negative.  Negative for sore throat.   Eyes: Negative.   Respiratory: Negative.  Negative for cough, choking, shortness of breath and stridor.   Cardiovascular: Negative.   Gastrointestinal: Negative.   Negative for abdominal pain.  Endocrine: Negative.   Genitourinary: Negative.  Negative for dysuria, discharge, scrotal swelling, genital sores and testicular pain.  Musculoskeletal: Negative.  Negative for myalgias.  Skin: Negative.  Negative for color change and rash.  Allergic/Immunologic: Negative.  Negative for immunocompromised state.  Neurological: Negative.  Negative for syncope.  Hematological: Negative.  Negative for adenopathy. Does not bruise/bleed easily.  Psychiatric/Behavioral: Negative.     Objective:  BP 108/70 mmHg  Pulse 88  Temp(Src) 98.3 F (36.8 C) (Oral)  Resp 16  Ht 5' 6.75" (1.695 m)  Wt 208 lb (94.348 kg)  BMI 32.84 kg/m2  SpO2 91%  Physical Exam  Constitutional: He is oriented to person, place, and time. He appears well-developed and well-nourished. No distress.  HENT:  Head: Normocephalic and atraumatic.  Mouth/Throat: Oropharynx is clear and moist. No oropharyngeal exudate.  Eyes: Conjunctivae are normal. Right eye exhibits no discharge. Left eye exhibits no discharge. No scleral icterus.  Neck: Normal range of motion. Neck supple. No JVD present. No tracheal deviation present. No thyromegaly present.  Cardiovascular: Normal rate, regular rhythm, normal heart sounds and intact distal pulses.  Exam reveals no gallop and no friction rub.   No murmur heard. Pulmonary/Chest: Effort normal and breath sounds normal. No stridor. No respiratory distress. He has no wheezes. He has no rales. He exhibits no tenderness.  Abdominal: Soft. He exhibits no distension and no mass. There is no tenderness. There is no rebound and no guarding. Hernia confirmed negative in the right inguinal area and confirmed negative in the left inguinal area.  Genitourinary: Testes normal and penis normal. Right testis shows  no mass, no swelling and no tenderness. Right testis is descended. Left testis shows no mass, no swelling and no tenderness. Left testis is descended. Uncircumcised.  No phimosis, paraphimosis, hypospadias, penile erythema or penile tenderness. No discharge found.  Musculoskeletal: Normal range of motion. He exhibits no edema or tenderness.  Lymphadenopathy:    He has no cervical adenopathy.       Right: No inguinal adenopathy present.       Left: No inguinal adenopathy present.  Neurological: He is oriented to person, place, and time.  Skin: Skin is warm and dry. No rash noted. He is not diaphoretic. No erythema. No pallor.  Psychiatric: He has a normal mood and affect. His behavior is normal. Judgment and thought content normal.  Vitals reviewed.     Assessment & Plan:   Azeem was seen today for annual exam.  Diagnoses and all orders for this visit:  Annual physical exam- his vaccines were reviewed and updated, exam completed, labs ordered and reviewed, he was given patient education material. -     TSH; Future -     Urinalysis, Routine w reflex microscopic (not at [facility]); Future -     HIV antibody; Future -     RPR; Future -     Lipid panel; Future -     Comprehensive metabolic panel; Future -     CBC with Differential/Platelet; Future  High risk sexual behavior- he is HIV negative and his renal function is normal, he was counseled and given patient information regarding safe sexual practices, I encouraged him to use condoms for all sexual encounters, he is high risk for contracting HIV so will start Truvada. -     emtricitabine-tenofovir (TRUVADA) 200-300 MG tablet; Take 1 tablet by mouth daily.   I have discontinued Mr. Stewart's HYDROcodone-acetaminophen. I am also having him start on emtricitabine-tenofovir. Additionally, I am having him maintain his sildenafil.  Meds ordered this encounter  Medications  . emtricitabine-tenofovir (TRUVADA) 200-300 MG tablet    Sig: Take 1 tablet by mouth daily.    Dispense:  90 tablet    Refill:  1     Follow-up: Return in about 4 weeks (around 08/05/2015).  Scarlette Calico, MD

## 2015-08-12 ENCOUNTER — Encounter: Payer: Managed Care, Other (non HMO) | Admitting: Internal Medicine

## 2015-09-16 ENCOUNTER — Encounter: Payer: Managed Care, Other (non HMO) | Admitting: Internal Medicine

## 2015-09-16 ENCOUNTER — Ambulatory Visit: Payer: Self-pay | Admitting: Internal Medicine

## 2015-09-23 ENCOUNTER — Encounter: Payer: Self-pay | Admitting: Internal Medicine

## 2015-09-23 ENCOUNTER — Ambulatory Visit (INDEPENDENT_AMBULATORY_CARE_PROVIDER_SITE_OTHER): Payer: Managed Care, Other (non HMO) | Admitting: Internal Medicine

## 2015-09-23 ENCOUNTER — Other Ambulatory Visit (INDEPENDENT_AMBULATORY_CARE_PROVIDER_SITE_OTHER): Payer: Managed Care, Other (non HMO)

## 2015-09-23 VITALS — BP 102/72 | HR 59 | Ht 67.0 in | Wt 201.2 lb

## 2015-09-23 VITALS — BP 110/74 | HR 67 | Temp 98.4°F | Resp 16 | Ht 67.0 in | Wt 200.0 lb

## 2015-09-23 DIAGNOSIS — I495 Sick sinus syndrome: Secondary | ICD-10-CM | POA: Diagnosis not present

## 2015-09-23 DIAGNOSIS — A510 Primary genital syphilis: Secondary | ICD-10-CM | POA: Diagnosis not present

## 2015-09-23 DIAGNOSIS — Z7251 High risk heterosexual behavior: Secondary | ICD-10-CM | POA: Diagnosis not present

## 2015-09-23 DIAGNOSIS — Z5181 Encounter for therapeutic drug level monitoring: Secondary | ICD-10-CM | POA: Insufficient documentation

## 2015-09-23 LAB — URINALYSIS, ROUTINE W REFLEX MICROSCOPIC
Bilirubin Urine: NEGATIVE
Ketones, ur: NEGATIVE
Leukocytes, UA: NEGATIVE
Nitrite: NEGATIVE
RBC / HPF: NONE SEEN (ref 0–?)
SPECIFIC GRAVITY, URINE: 1.02 (ref 1.000–1.030)
Total Protein, Urine: NEGATIVE
URINE GLUCOSE: NEGATIVE
Urobilinogen, UA: 0.2 (ref 0.0–1.0)
WBC UA: NONE SEEN (ref 0–?)
pH: 5.5 (ref 5.0–8.0)

## 2015-09-23 LAB — BASIC METABOLIC PANEL
BUN: 18 mg/dL (ref 6–23)
CHLORIDE: 104 meq/L (ref 96–112)
CO2: 28 mEq/L (ref 19–32)
CREATININE: 0.95 mg/dL (ref 0.40–1.50)
Calcium: 9.8 mg/dL (ref 8.4–10.5)
GFR: 96.38 mL/min (ref >60.00)
Glucose, Bld: 119 mg/dL — ABNORMAL HIGH (ref 70–99)
Potassium: 4.2 mEq/L (ref 3.5–5.1)
Sodium: 140 mEq/L (ref 135–145)

## 2015-09-23 NOTE — Progress Notes (Signed)
Electrophysiology Office Note   Date:  09/23/2015   ID:  Chase Parker, DOB 04-30-1981, MRN [id number]  PCP:  Chase Calico, MD   Primary Electrophysiologist: Chase Grayer, MD    Chief Complaint  Patient presents with  . Follow-up    no complaints     History of Present Illness: Chase Parker is a 35 y.o. male who presents today for electrophysiology evaluation.   He is doing very well .  He is moving to [facility] [place] for work but would like to continue to follow here for healthcare. Today, he denies symptoms of palpitations, chest pain, shortness of breath, orthopnea, PND, lower extremity edema, claudication, dizziness, presyncope, syncope, bleeding, or neurologic sequela. The patient is tolerating medications without difficulties and is otherwise without complaint today.    Past Medical History  Diagnosis Date  . Sick sinus syndrome [facility])     s/p PPM implant in [place]  . GERD (gastroesophageal reflux disease)   . 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 Ignacia Bayley  . Mass of left submandibular region 12/2014    CT scan of neck performed on 01/16/2015, showed no obvious invasion of surrounding structure, no lymphadenopathy, plan: left submandibular gland exicision intending adequate surgical margins  . 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 Neoplasm of submandibular gland     Dr. Vicie Mutters at [facility], recommends removal and node dissection  . Hepatitis     hep B   Past Surgical History  Procedure Laterality Date  . Pacemaker insertion  05/06/2005    SJM implanted at [facility] in [place]  . Leg surgery      left    age 6  . Submandibular gland excision Left 03/06/2015  . Submandibular gland excision Left 03/06/2015    Procedure: EXCISION  LEFT SUBMANDIBULAR GLAND;  Surgeon: Jodi Marble, MD;  Location: [facility] OR;  Service: ENT;  Laterality: Left;     Current Outpatient Prescriptions  Medication Sig Dispense Refill    . emtricitabine-tenofovir (TRUVADA) 200-300 MG tablet Take 1 tablet by mouth daily. 90 tablet 1  . sildenafil (VIAGRA) 100 MG tablet Take 0.5-1 tablets (50-100 mg total) by mouth daily as needed for erectile dysfunction. 3 tablet 6   No current facility-administered medications for this visit.    Allergies:   Review of patient's allergies indicates no known allergies.   Social History:  The patient  reports that he has never smoked. He has never used smokeless tobacco. He reports that he does not drink alcohol or use illicit drugs.    ROS:  Please see the history of present illness.   All other systems are reviewed and negative.    PHYSICAL EXAM: VS:  BP 102/72 mmHg  Pulse 59  Ht 5\' 7"  (1.702 m)  Wt 201 lb 3.2 oz (91.264 kg)  BMI 31.51 kg/m2 , BMI Body mass index is 31.51 kg/(m^2). GEN: Well nourished, well developed, in no acute distress HEENT: normal Neck: no JVD, carotid bruits, or masses Cardiac: RRR; no murmurs, rubs, or gallops,no edema  Respiratory:  clear to auscultation bilaterally, normal work of breathing GI: soft, nontender, nondistended, + BS MS: no deformity or atrophy Skin: warm and dry, device pocket is well healed Neuro:  Strength and sensation are intact Psych: euthymic mood, full affect  Device interrogation is reviewed today in detail.  See PaceArt for details.   Recent Labs: 07/10/2015: ALT 56*; BUN 18; Creatinine, Ser 1.09; Hemoglobin 15.4;  Platelets 211.0; Potassium 4.5; Sodium 140; TSH 1.07    Lipid Panel     Component Value Date/Time   CHOL 165 07/10/2015 0831   TRIG 126.0 07/10/2015 0831   HDL 36.80* 07/10/2015 0831   CHOLHDL 4 07/10/2015 0831   VLDL 25.2 07/10/2015 0831   LDLCALC 103* 07/10/2015 0831   LDLDIRECT 135.0 08/09/2014 0815     Wt Readings from Last 3 Encounters:  09/23/15 201 lb 3.2 oz (91.264 kg)  07/08/15 208 lb (94.348 kg)  03/06/15 202 lb (91.627 kg)      ASSESSMENT AND PLAN:  1.  Sick sinus syndrome Normal  pacemaker function See Pace Art report No changes today  2. Obesity Weight loss encouraged He is doing very well with this   Return to se device clinic RN in 6 months I will see again in 1 year  Signed, Chase Grayer, MD  09/23/2015 2:45 PM     [place] [address] [place] [address] [phone] (office) [phone] (fax)

## 2015-09-23 NOTE — Patient Instructions (Signed)
Medication Instructions:  Your physician recommends that you continue on your current medications as directed. Please refer to the Current Medication list given to you today.   Labwork: None ordered   Testing/Procedures: None ordered   Follow-Up: Your physician wants you to follow-up in: 6 months in the device clinic and 12 months with Dr Allred You will receive a reminder letter in the mail two months in advance. If you don't receive a letter, please call our office to schedule the follow-up appointment.     Any Other Special Instructions Will Be Listed Below (If Applicable).     If you need a refill on your cardiac medications before your next appointment, please call your pharmacy.   

## 2015-09-23 NOTE — Patient Instructions (Signed)
Safe Sex  Safe sex is about reducing the risk of giving or getting a sexually transmitted disease (STD). STDs are spread through sexual contact involving the genitals, mouth, or rectum. Some STDs can be cured and others cannot. Safe sex can also prevent unintended pregnancies.   WHAT ARE SOME SAFE SEX PRACTICES?  · Limit your sexual activity to only one partner who is having sex with only you.  · Talk to your partner about his or her past partners, past STDs, and drug use.  · Use a condom every time you have sexual intercourse. This includes vaginal, oral, and anal sexual activity. Both females and males should wear condoms during oral sex. Only use latex or polyurethane condoms and water-based lubricants. Using petroleum-based lubricants or oils to lubricate a condom will weaken the condom and increase the chance that it will break. The condom should be in place from the beginning to the end of sexual activity. Wearing a condom reduces, but does not completely eliminate, your risk of getting or giving an STD. STDs can be spread by contact with infected body fluids and skin.  · Get vaccinated for hepatitis B and HPV.  · Avoid alcohol and recreational drugs, which can affect your judgment. You may forget to use a condom or participate in high-risk sex.  · For females, avoid douching after sexual intercourse. Douching can spread an infection farther into the reproductive tract.  · Check your body for signs of sores, blisters, rashes, or unusual discharge. See your health care provider if you notice any of these signs.  · Avoid sexual contact if you have symptoms of an infection or are being treated for an STD. If you or your partner has herpes, avoid sexual contact when blisters are present. Use condoms at all other times.  · If you are at risk of being infected with HIV, it is recommended that you take a prescription medicine daily to prevent HIV infection. This is called pre-exposure prophylaxis (PrEP). You are  considered at risk if:    You are a man who has sex with other men (MSM).    You are a heterosexual man or woman who is sexually active with more than one partner.    You take drugs by injection.    You are sexually active with a partner who has HIV.  · Talk with your health care provider about whether you are at high risk of being infected with HIV. If you choose to begin PrEP, you should first be tested for HIV. You should then be tested every 3 months for as long as you are taking PrEP.  · See your health care provider for regular screenings, exams, and tests for other STDs. Before having sex with a new partner, each of you should be screened for STDs and should talk about the results with each other.  WHAT ARE THE BENEFITS OF SAFE SEX?   · There is less chance of getting or giving an STD.  · You can prevent unwanted or unintended pregnancies.  · By discussing safe sex concerns with your partner, you may increase feelings of intimacy, comfort, trust, and honesty between the two of you.     This information is not intended to replace advice given to you by your health care provider. Make sure you discuss any questions you have with your health care provider.     Document Released: 06/25/2004 Document Revised: 06/08/2014 Document Reviewed: 11/09/2011  Elsevier Interactive Patient Education ©2016 [employer]

## 2015-09-23 NOTE — Progress Notes (Signed)
Pre visit review using our clinic review tool, if applicable. No additional management support is needed unless otherwise documented below in the visit note. 

## 2015-09-24 LAB — CUP PACEART INCLINIC DEVICE CHECK
Battery Impedance: 1800 Ohm
Battery Voltage: 2.76 V
Implantable Lead Implant Date: 20061205
Implantable Lead Implant Date: 20061205
Implantable Lead Location: 753860
Lead Channel Impedance Value: 311 Ohm
Lead Channel Impedance Value: 635 Ohm
Lead Channel Pacing Threshold Amplitude: 0.5 V
Lead Channel Pacing Threshold Pulse Width: 0.4 ms
Lead Channel Sensing Intrinsic Amplitude: 3.7 mV
Lead Channel Setting Pacing Amplitude: 2.5 V
MDC IDC LEAD LOCATION: 753859
MDC IDC MSMT LEADCHNL RV PACING THRESHOLD AMPLITUDE: 1.5 V
MDC IDC MSMT LEADCHNL RV PACING THRESHOLD PULSEWIDTH: 1 ms
MDC IDC MSMT LEADCHNL RV SENSING INTR AMPL: 5.7 mV
MDC IDC PG SERIAL: 1546854
MDC IDC SESS DTM: 20170424182357
MDC IDC SET LEADCHNL RA PACING AMPLITUDE: 2 V
MDC IDC SET LEADCHNL RV PACING PULSEWIDTH: 1 ms
MDC IDC SET LEADCHNL RV SENSING SENSITIVITY: 2 mV
MDC IDC STAT BRADY RA PERCENT PACED: 8.9 %
MDC IDC STAT BRADY RV PERCENT PACED: 1.5 %

## 2015-09-25 DIAGNOSIS — A510 Primary genital syphilis: Secondary | ICD-10-CM | POA: Insufficient documentation

## 2015-09-25 LAB — RPR: RPR: REACTIVE — AB

## 2015-09-25 LAB — RPR TITER: RPR Titer: 1:32 {titer} — AB

## 2015-09-25 NOTE — Progress Notes (Signed)
Subjective:  Patient ID: Chase Parker, male    DOB: 1981-02-11  Age: 35 y.o. MRN: [id number]  CC: Penis Injury   HPI BRADEY SOUTHARDS presents for follow-up on Truvada therapy for high risk sexual behavior. He tells me that a week or 2 ago he developed some painless ulcers on the right shaft of his penis. He tells me the ulcers have now resolved. He shows me a picture of the ulcers today, there is a small/superficial ulcer on the right/lateral distal shaft. He is tolerating Truvada well with no rash, abdominal pain, nausea, loss of appetite, dysuria, or hematuria.  Outpatient Prescriptions Prior to Visit  Medication Sig Dispense Refill  . emtricitabine-tenofovir (TRUVADA) 200-300 MG tablet Take 1 tablet by mouth daily. 90 tablet 1  . sildenafil (VIAGRA) 100 MG tablet Take 0.5-1 tablets (50-100 mg total) by mouth daily as needed for erectile dysfunction. 3 tablet 6   No facility-administered medications prior to visit.    ROS Review of Systems  Constitutional: Negative.  Negative for fever, chills, diaphoresis, appetite change and fatigue.  HENT: Negative.   Eyes: Negative.   Respiratory: Negative.  Negative for cough, choking, chest tightness, shortness of breath and stridor.   Cardiovascular: Negative.  Negative for chest pain, palpitations and leg swelling.  Gastrointestinal: Negative.  Negative for nausea, vomiting, abdominal pain and diarrhea.  Endocrine: Negative.   Genitourinary: Positive for genital sores. Negative for dysuria, urgency, frequency, hematuria, discharge, penile swelling, scrotal swelling, difficulty urinating, penile pain and testicular pain.  Musculoskeletal: Negative.  Negative for myalgias, back pain, arthralgias and neck pain.  Skin: Negative.  Negative for pallor and rash.  Neurological: Negative.   Hematological: Negative.  Negative for adenopathy. Does not bruise/bleed easily.  Psychiatric/Behavioral: Negative.     Objective:  BP 110/74 mmHg  Pulse 67   Temp(Src) 98.4 F (36.9 C) (Oral)  Resp 16  Ht 5\' 7"  (1.702 m)  Wt 200 lb (90.719 kg)  BMI 31.32 kg/m2  SpO2 96%  BP Readings from Last 3 Encounters:  09/23/15 110/74  09/23/15 102/72  07/08/15 108/70    Wt Readings from Last 3 Encounters:  09/23/15 200 lb (90.719 kg)  09/23/15 201 lb 3.2 oz (91.264 kg)  07/08/15 208 lb (94.348 kg)    Physical Exam  Constitutional: He is oriented to person, place, and time. No distress.  HENT:  Mouth/Throat: Oropharynx is clear and moist. No oropharyngeal exudate.  Eyes: Conjunctivae are normal. Right eye exhibits no discharge. Left eye exhibits no discharge. No scleral icterus.  Neck: Normal range of motion. Neck supple. No JVD present. No tracheal deviation present. No thyromegaly present.  Cardiovascular: Normal rate, regular rhythm, normal heart sounds and intact distal pulses.  Exam reveals no gallop and no friction rub.   No murmur heard. Pulmonary/Chest: Effort normal and breath sounds normal. No stridor. No respiratory distress. He has no wheezes. He has no rales. He exhibits no tenderness.  Abdominal: Soft. Bowel sounds are normal. He exhibits no distension and no mass. There is no tenderness. There is no rebound and no guarding.  Musculoskeletal: Normal range of motion. He exhibits no edema or tenderness.  Lymphadenopathy:    He has no cervical adenopathy.    He has no axillary adenopathy.       Right: No inguinal and no supraclavicular adenopathy present.       Left: No inguinal and no supraclavicular adenopathy present.  Neurological: He is oriented to person, place, and time.  Skin: Skin  is warm and dry. No rash noted. He is not diaphoretic. No erythema. No pallor.  Vitals reviewed.   Lab Results  Component Value Date   WBC 6.0 07/10/2015   HGB 15.4 07/10/2015   HCT 46.0 07/10/2015   PLT 211.0 07/10/2015   GLUCOSE 119* 09/23/2015   CHOL 165 07/10/2015   TRIG 126.0 07/10/2015   HDL 36.80* 07/10/2015   LDLDIRECT 135.0  08/09/2014   LDLCALC 103* 07/10/2015   ALT 56* 07/10/2015   AST 82* 07/10/2015   NA 140 09/23/2015   K 4.2 09/23/2015   CL 104 09/23/2015   CREATININE 0.95 09/23/2015   BUN 18 09/23/2015   CO2 28 09/23/2015   TSH 1.07 07/10/2015   INR 1.07 02/28/2015    No results found.  Assessment & Plan:   [place] was seen today for penis injury.  Diagnoses and all orders for this visit:  High risk sexual behavior- will continue Truvada for HIV prevention -     Basic metabolic panel; Future -     Urinalysis, Routine w reflex microscopic (not at [facility]); Future -     RPR; Future  Encounter for therapeutic drug monitoring- He is tolerating Truvada well, his renal function has remained stable, will continue. -     Basic metabolic panel; Future -     Urinalysis, Routine w reflex microscopic (not at [facility]); Future -     RPR; Future  Early syphilis, genital (primary)- his RPR is now positive at 1:32, it was negative 2 months ago, his FTA-ABS is also positive, I've asked him to return for an injection of benzathine penicillin 2.4 million units IM.   I am having Mr. Ciesla maintain his sildenafil and emtricitabine-tenofovir.  No orders of the defined types were placed in this encounter.     Follow-up: Return in about 6 months (around 03/24/2016).  Scarlette Calico, MD

## 2015-09-26 ENCOUNTER — Encounter: Payer: Self-pay | Admitting: Internal Medicine

## 2015-09-26 LAB — FLUORESCENT TREPONEMAL AB(FTA)-IGG-BLD: FLUORESCENT TREPONEMAL ABS: REACTIVE — AB

## 2015-09-26 NOTE — Telephone Encounter (Signed)
Please call patient back [phone] regarding injection

## 2015-09-26 NOTE — Telephone Encounter (Signed)
On schedule with [facility]field tomorrow

## 2015-09-26 NOTE — Telephone Encounter (Signed)
lvm advising patient to call back and schedule nurse visit with Brassfield tomorrow anytime or [place] in the afternoon.

## 2015-09-27 ENCOUNTER — Ambulatory Visit (INDEPENDENT_AMBULATORY_CARE_PROVIDER_SITE_OTHER): Payer: Managed Care, Other (non HMO) | Admitting: *Deleted

## 2015-09-27 ENCOUNTER — Other Ambulatory Visit: Payer: Self-pay | Admitting: Internal Medicine

## 2015-09-27 ENCOUNTER — Other Ambulatory Visit: Payer: Self-pay | Admitting: Adult Health

## 2015-09-27 DIAGNOSIS — A539 Syphilis, unspecified: Secondary | ICD-10-CM | POA: Diagnosis not present

## 2015-09-27 MED ORDER — PENICILLIN G BENZATHINE 1200000 UNIT/2ML IM SUSP
2.4000 10*6.[IU] | Freq: Once | INTRAMUSCULAR | Status: AC
  Administered 2015-09-27: 2.4 10*6.[IU] via INTRAMUSCULAR

## 2015-10-08 ENCOUNTER — Telehealth: Payer: Self-pay

## 2015-10-08 NOTE — Telephone Encounter (Signed)
Chase Parker with [facility] called to verify recent dx of syphillis and that treatment was given for same.  Verified reactive RPR and bicillin 2.4 million units was the tx given to patient.

## 2015-10-24 ENCOUNTER — Other Ambulatory Visit (INDEPENDENT_AMBULATORY_CARE_PROVIDER_SITE_OTHER): Payer: Managed Care, Other (non HMO)

## 2015-10-24 ENCOUNTER — Ambulatory Visit (INDEPENDENT_AMBULATORY_CARE_PROVIDER_SITE_OTHER): Payer: Managed Care, Other (non HMO) | Admitting: Internal Medicine

## 2015-10-24 ENCOUNTER — Encounter: Payer: Self-pay | Admitting: Internal Medicine

## 2015-10-24 VITALS — BP 126/80 | HR 81 | Temp 98.2°F | Resp 16 | Ht 66.0 in | Wt 201.0 lb

## 2015-10-24 DIAGNOSIS — A510 Primary genital syphilis: Secondary | ICD-10-CM

## 2015-10-24 DIAGNOSIS — R21 Rash and other nonspecific skin eruption: Secondary | ICD-10-CM

## 2015-10-24 DIAGNOSIS — B356 Tinea cruris: Secondary | ICD-10-CM

## 2015-10-24 LAB — COMPREHENSIVE METABOLIC PANEL
ALBUMIN: 4.8 g/dL (ref 3.5–5.2)
ALK PHOS: 62 U/L (ref 39–117)
ALT: 51 U/L (ref 0–53)
AST: 28 U/L (ref 0–37)
BUN: 19 mg/dL (ref 6–23)
CALCIUM: 9.8 mg/dL (ref 8.4–10.5)
CHLORIDE: 106 meq/L (ref 96–112)
CO2: 27 mEq/L (ref 19–32)
CREATININE: 1.14 mg/dL (ref 0.40–1.50)
GFR: 78.05 mL/min (ref >60.00)
Glucose, Bld: 95 mg/dL (ref 70–99)
Potassium: 3.8 mEq/L (ref 3.5–5.1)
SODIUM: 139 meq/L (ref 135–145)
TOTAL PROTEIN: 7.6 g/dL (ref 6.0–8.3)
Total Bilirubin: 0.5 mg/dL (ref 0.2–1.2)

## 2015-10-24 LAB — CBC WITH DIFFERENTIAL/PLATELET
BASOS ABS: 0 10*3/uL (ref 0.0–0.1)
Basophils Relative: 0.5 % (ref 0.0–3.0)
EOS ABS: 0.2 10*3/uL (ref 0.0–0.7)
Eosinophils Relative: 3.6 % (ref 0.0–5.0)
HEMATOCRIT: 45.6 % (ref 39.0–52.0)
HEMOGLOBIN: 15.3 g/dL (ref 13.0–17.0)
LYMPHS PCT: 36.6 % (ref 12.0–46.0)
Lymphs Abs: 2.5 10*3/uL (ref 0.7–4.0)
MCHC: 33.6 g/dL (ref 30.0–36.0)
MCV: 81.2 fl (ref 78.0–100.0)
MONO ABS: 0.6 10*3/uL (ref 0.1–1.0)
Monocytes Relative: 9.1 % (ref 3.0–12.0)
Neutro Abs: 3.4 10*3/uL (ref 1.4–7.7)
Neutrophils Relative %: 50.2 % (ref 43.0–77.0)
Platelets: 238 10*3/uL (ref 150.0–400.0)
RBC: 5.62 Mil/uL (ref 4.22–5.81)
RDW: 14.6 % (ref 11.5–15.5)
WBC: 6.8 10*3/uL (ref 4.0–10.5)

## 2015-10-24 MED ORDER — TERBINAFINE HCL 250 MG PO TABS: 250.0000 mg | ORAL_TABLET | Freq: Every day | ORAL | Status: AC

## 2015-10-24 NOTE — Progress Notes (Signed)
Pre visit review using our clinic review tool, if applicable. No additional management support is needed unless otherwise documented below in the visit note. 

## 2015-10-24 NOTE — Progress Notes (Signed)
Subjective:  Patient ID: Chase Parker, male    DOB: [birth year]  Age: 35 y.o. MRN: [id number]  CC: Follow-up and Rash   HPI Chase Parker presents for follow-up one month after being diagnosed with primary syphilis and receiving 2.4 million units of Bicillin LA IM. He tells me that the penile ulcers have resolved and he denies fever, sore throat, chills, lymphadenopathy, or dysuria.  He has a new complaint of symmetrical rash on his buttocks. He thought it was his usual eczema so he has been  applying topical hydrocortisone cream with no relief from the symptoms. The area feels irritated and the itching is very mild.  Outpatient Prescriptions Prior to Visit  Medication Sig Dispense Refill  . emtricitabine-tenofovir (TRUVADA) 200-300 MG tablet Take 1 tablet by mouth daily. 90 tablet 1  . sildenafil (VIAGRA) 100 MG tablet Take 0.5-1 tablets (50-100 mg total) by mouth daily as needed for erectile dysfunction. 3 tablet 6   No facility-administered medications prior to visit.    ROS Review of Systems  Constitutional: Negative.  Negative for fever, chills, diaphoresis, appetite change and fatigue.  HENT: Negative.  Negative for sinus pressure, sore throat and trouble swallowing.   Eyes: Negative.  Negative for pain and visual disturbance.  Respiratory: Negative for cough, choking, chest tightness, shortness of breath and stridor.   Cardiovascular: Negative.  Negative for chest pain, palpitations and leg swelling.  Gastrointestinal: Negative.  Negative for nausea, vomiting, abdominal pain and diarrhea.  Endocrine: Negative.   Genitourinary: Negative.   Musculoskeletal: Negative.  Negative for myalgias, back pain, joint swelling, arthralgias and neck pain.  Skin: Positive for rash. Negative for color change and pallor.  Allergic/Immunologic: Negative.   Neurological: Negative.  Negative for dizziness, tremors, syncope, light-headedness and headaches.  Hematological: Negative.  Negative for  adenopathy. Does not bruise/bleed easily.  Psychiatric/Behavioral: Negative.     Objective:  BP 126/80 mmHg  Pulse 81  Temp(Src) 98.2 F (36.8 C) (Oral)  Resp 16  Ht 5\' 6"  (1.676 m)  Wt 201 lb (91.173 kg)  BMI 32.46 kg/m2  SpO2 97%  BP Readings from Last 3 Encounters:  10/24/15 126/80  09/23/15 110/74  09/23/15 102/72    Wt Readings from Last 3 Encounters:  10/24/15 201 lb (91.173 kg)  09/23/15 200 lb (90.719 kg)  09/23/15 201 lb 3.2 oz (91.264 kg)    Physical Exam  Constitutional: He is oriented to person, place, and time. No distress.  HENT:  Mouth/Throat: Oropharynx is clear and moist. No oropharyngeal exudate.  Eyes: Conjunctivae are normal. Right eye exhibits no discharge. Left eye exhibits no discharge. No scleral icterus.  Neck: Normal range of motion. Neck supple. No JVD present. No tracheal deviation present. No thyromegaly present.  Cardiovascular: Normal rate, regular rhythm, normal heart sounds and intact distal pulses.  Exam reveals no gallop and no friction rub.   No murmur heard. Pulmonary/Chest: Effort normal and breath sounds normal. No stridor. No respiratory distress. He has no wheezes. He has no rales. He exhibits no tenderness.  Abdominal: Soft. Bowel sounds are normal. He exhibits no distension and no mass. There is no tenderness. There is no rebound and no guarding.  Musculoskeletal: He exhibits no edema or tenderness.  Lymphadenopathy:    He has no cervical adenopathy.  Neurological: He is oriented to person, place, and time.  Skin: Skin is warm and dry. Rash noted. He is not diaphoretic. No erythema. No pallor.     Over both buttocks, symmetrically,  there is a serpiginous rash with a leading edge of raised papules with scale and slight erythema. There are no vesicles, pustules, exudate, induration, streaking.  Psychiatric: He has a normal mood and affect. His behavior is normal. Judgment and thought content normal.  Vitals reviewed.   Lab  Results  Component Value Date   WBC 6.8 10/24/2015   HGB 15.3 10/24/2015   HCT 45.6 10/24/2015   PLT 238.0 10/24/2015   GLUCOSE 95 10/24/2015   CHOL 165 07/10/2015   TRIG 126.0 07/10/2015   HDL 36.80* 07/10/2015   LDLDIRECT 135.0 08/09/2014   LDLCALC 103* 07/10/2015   ALT 51 10/24/2015   AST 28 10/24/2015   NA 139 10/24/2015   K 3.8 10/24/2015   CL 106 10/24/2015   CREATININE 1.14 10/24/2015   BUN 19 10/24/2015   CO2 27 10/24/2015   TSH 1.07 07/10/2015   INR 1.07 02/28/2015    No results found.  Assessment & Plan:   Chase Parker was seen today for follow-up and rash.  Diagnoses and all orders for this visit:  Early syphilis, genital (primary)- His RPR titer is down to 1:8, this is an excellent response, he will need to have this rechecked in 2 months, at also 6 month intervals in one year intervals. -     Comprehensive metabolic panel; Future -     CBC with Differential/Platelet; Future -     HIV antibody; Future -     RPR; Future  Rash and nonspecific skin eruption- his labs do not show any systemic causes for his rash -     CBC with Differential/Platelet; Future -     HIV antibody; Future -     RPR; Future  Tinea of perianal region- his liver enzymes are normal so I will treat this with a 2 week course of oral terbinafine -     terbinafine (LAMISIL) 250 MG tablet; Take 1 tablet (250 mg total) by mouth daily.   I am having Chase Parker start on terbinafine. I am also having him maintain his sildenafil and emtricitabine-tenofovir.  Meds ordered this encounter  Medications  . terbinafine (LAMISIL) 250 MG tablet    Sig: Take 1 tablet (250 mg total) by mouth daily.    Dispense:  14 tablet    Refill:  0     Follow-up: Return in about 2 months (around 12/24/2015).  Scarlette Calico, MD

## 2015-10-24 NOTE — Patient Instructions (Signed)

## 2015-10-25 ENCOUNTER — Encounter: Payer: Self-pay | Admitting: Internal Medicine

## 2015-10-25 LAB — RPR: RPR Ser Ql: REACTIVE — AB

## 2015-10-25 LAB — HIV ANTIBODY (ROUTINE TESTING W REFLEX): HIV 1&2 Ab, 4th Generation: NONREACTIVE

## 2015-10-25 LAB — FLUORESCENT TREPONEMAL AB(FTA)-IGG-BLD: Fluorescent Treponemal ABS: REACTIVE — AB

## 2015-10-25 LAB — RPR TITER: RPR Titer: 1:8 {titer}

## 2015-12-15 IMAGING — CT CT NECK W/ CM
4 of 6 series · 14 of 33 positions shown, 16 images · IV contrast (75CC ISOVUE 300)
Comparison: None.

CLINICAL DATA: Slowly enlarging left submandibular mass, 5 years
duration. Inconclusive biopsy.

EXAM:
CT NECK WITH CONTRAST
TECHNIQUE: Multidetector CT imaging of the neck was performed using the
standard protocol following the bolus administration of intravenous
contrast.
CONTRAST:  75mL FTMOJX-WWW IOPAMIDOL (FTMOJX-WWW) INJECTION 61%

[Series 3: axial neck · axial · 0.41mm/px · z∈[+128,+202]mm · 2 of 90 slices shown]
[im 30/90  bone]
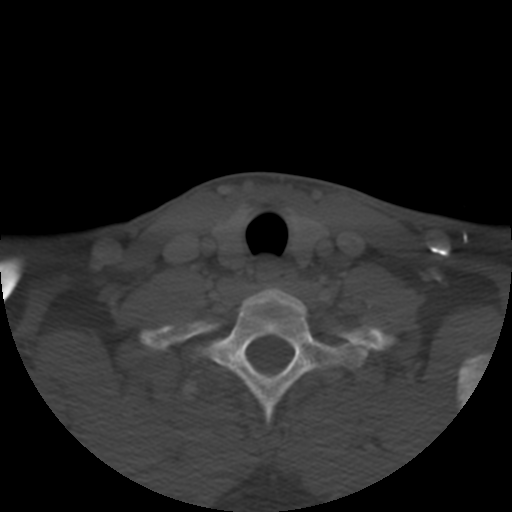
[im 60/90  bone]
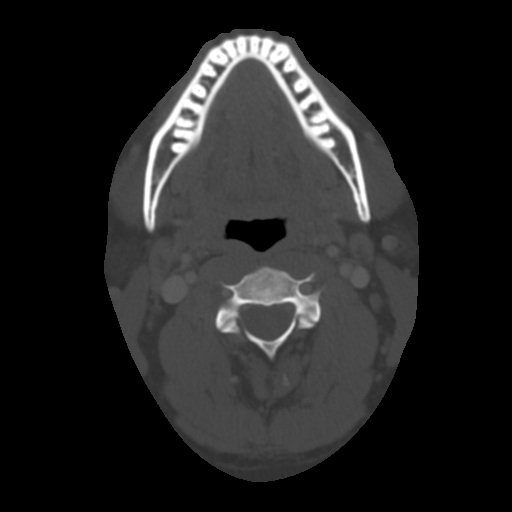

[Series 200: sagittal · sagittal · 0.45mm/px · 5 of 107 slices shown, 6 images]
[im 36/107  bone]
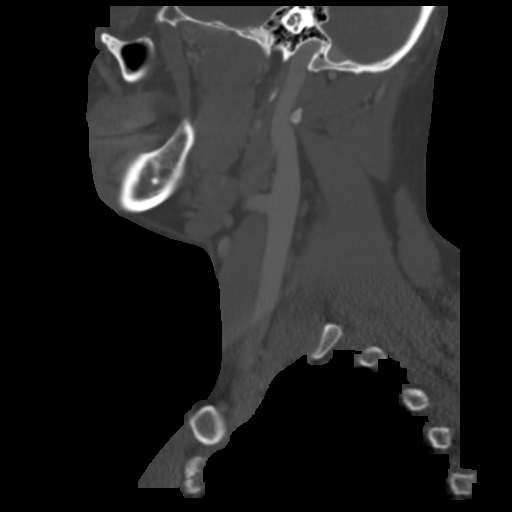
[im 45/107  bone]
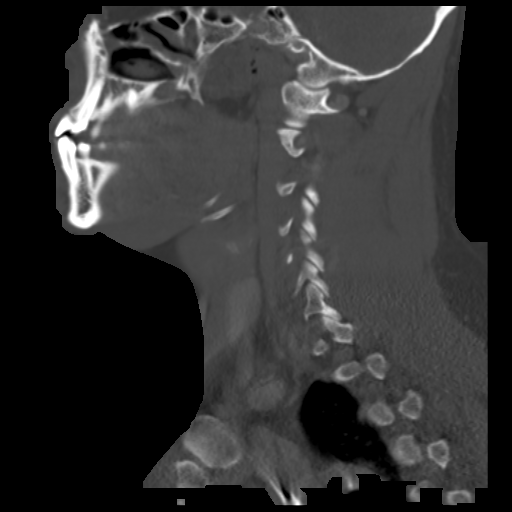
[im 54/107  soft-tissue]
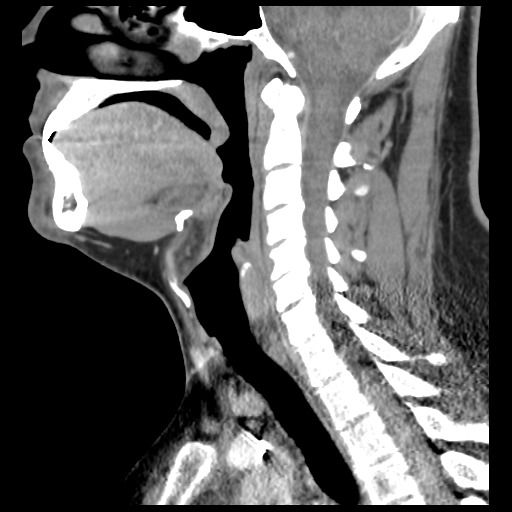
[im 54/107  bone]
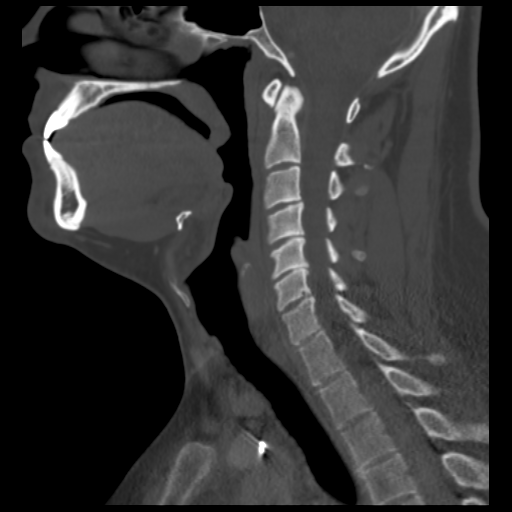
[im 62/107  bone]
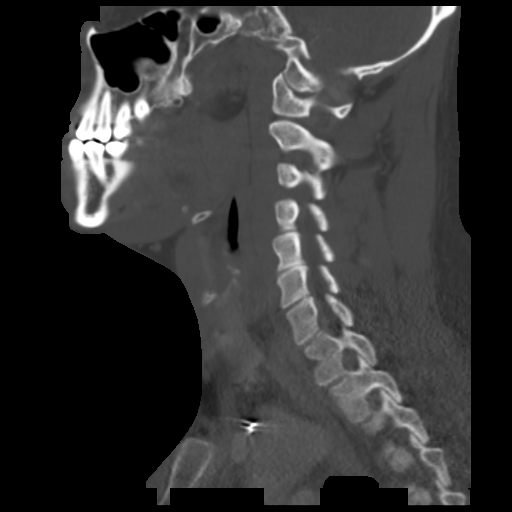
[im 71/107  bone]
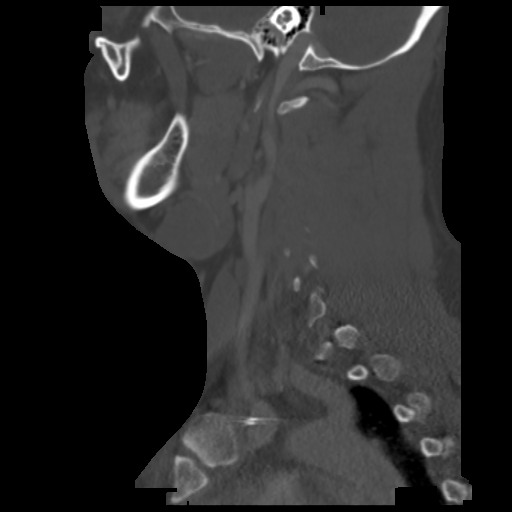

[Series 201: coronal · coronal · 0.45mm/px · 3 of 104 slices shown]
[im 31/104  bone]
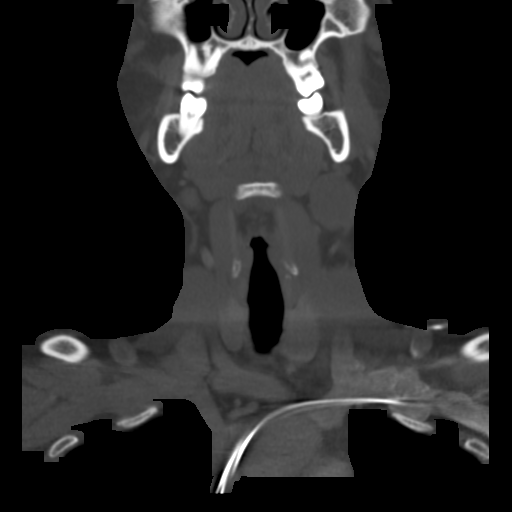
[im 45/104  bone]
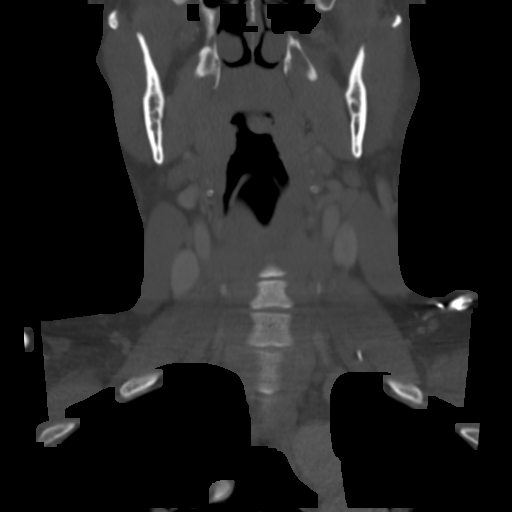
[im 59/104  bone]
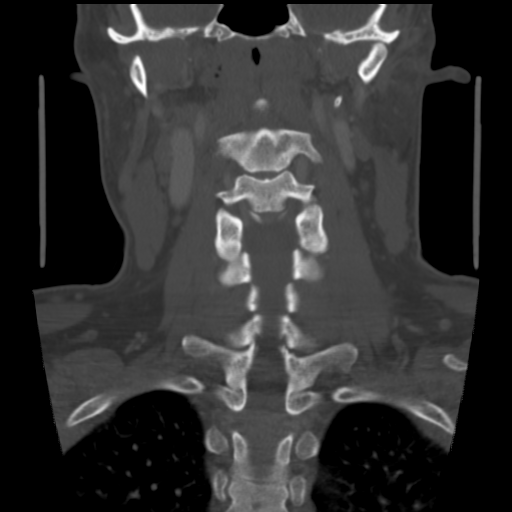

[Series 202: angled for hyoid · axial · 0.45mm/px · z∈[+48,+194]mm · 4 of 133 slices shown, 5 images]
[im 27/133  soft-tissue]
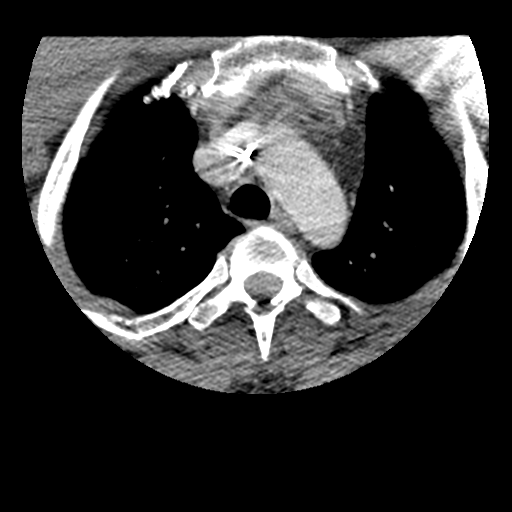
[im 27/133  bone]
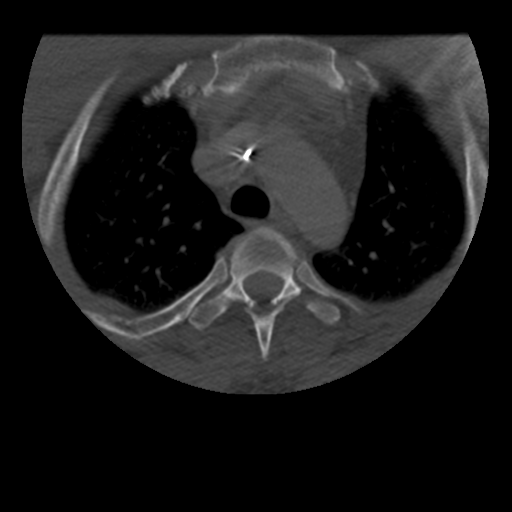
[im 53/133  bone]
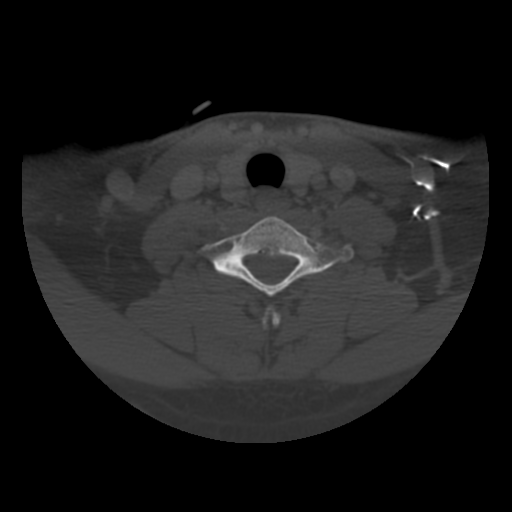
[im 80/133  bone]
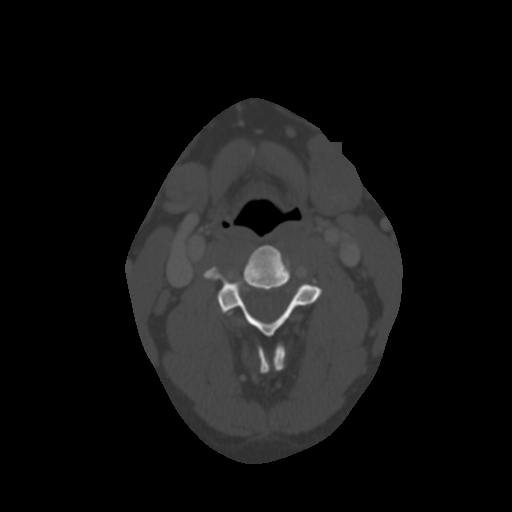
[im 106/133  bone]
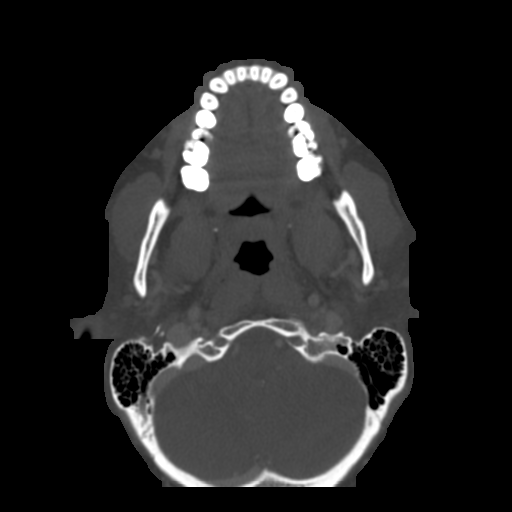

[14 of 33 positions shown; findings below may reference images not displayed]

FINDINGS: Pharynx and larynx: No mucosal or submucosal lesion.

Salivary glands: Both parotid glands are normal. The right
submandibular gland is normal. Left submandibular gland is enlarged
measuring 3.4 x 2.4 x 2.6 cm. The majority of this is probably due
to a low-density mass or infiltrative lesion, particularly the
anterior portion. The margins are discrete. Surrounding soft tissues
are not invaded. No calcifications. No low-density components.

Thyroid: Normal

Lymph nodes: No enlarged lymph nodes on either side of the neck.

Vascular: Arterial and venous structures are normal.

Limited intracranial: Normal

Visualized orbits: Limited, normal

Mastoids and visualized paranasal sinuses: Clear

Skeleton: Mild cervical spondylosis C5-6 and C6-7.

Upper chest: Normal
IMPRESSION: Markedly enlarged and low-density left submandibular gland measuring
3.4 x 2.4 x 2.6 cm.

3.4 x 2.4 x 2.6 cm. The majority of this gland is probably involved
by the primary pathologic process, particularly the anterior
portion. The differential diagnosis includes lymphoproliferative
disorders, unusual presentation of epithelial neoplasm, unusual
unilateral presentation of Sjogren syndrome, and Shalini Limon4 disease.

## 2015-12-24 ENCOUNTER — Other Ambulatory Visit: Payer: Self-pay | Admitting: Internal Medicine

## 2015-12-24 DIAGNOSIS — Z7251 High risk heterosexual behavior: Secondary | ICD-10-CM

## 2016-05-04 ENCOUNTER — Ambulatory Visit (INDEPENDENT_AMBULATORY_CARE_PROVIDER_SITE_OTHER): Payer: Managed Care, Other (non HMO) | Admitting: Internal Medicine

## 2016-05-04 ENCOUNTER — Encounter: Payer: Self-pay | Admitting: Internal Medicine

## 2016-05-04 ENCOUNTER — Other Ambulatory Visit (INDEPENDENT_AMBULATORY_CARE_PROVIDER_SITE_OTHER): Payer: Managed Care, Other (non HMO)

## 2016-05-04 ENCOUNTER — Ambulatory Visit (INDEPENDENT_AMBULATORY_CARE_PROVIDER_SITE_OTHER): Payer: Managed Care, Other (non HMO) | Admitting: *Deleted

## 2016-05-04 VITALS — BP 140/88 | HR 70 | Temp 98.1°F | Resp 16 | Ht 66.0 in | Wt 199.5 lb

## 2016-05-04 DIAGNOSIS — I495 Sick sinus syndrome: Secondary | ICD-10-CM | POA: Diagnosis not present

## 2016-05-04 DIAGNOSIS — A510 Primary genital syphilis: Secondary | ICD-10-CM | POA: Diagnosis not present

## 2016-05-04 DIAGNOSIS — Z7251 High risk heterosexual behavior: Secondary | ICD-10-CM | POA: Diagnosis not present

## 2016-05-04 DIAGNOSIS — Z23 Encounter for immunization: Secondary | ICD-10-CM | POA: Diagnosis not present

## 2016-05-04 LAB — URINALYSIS, ROUTINE W REFLEX MICROSCOPIC
BILIRUBIN URINE: NEGATIVE
HGB URINE DIPSTICK: NEGATIVE
Ketones, ur: NEGATIVE
Leukocytes, UA: NEGATIVE
Nitrite: NEGATIVE
PH: 6.5 (ref 5.0–8.0)
RBC / HPF: NONE SEEN (ref 0–?)
Specific Gravity, Urine: 1.01 (ref 1.000–1.030)
Total Protein, Urine: NEGATIVE
UROBILINOGEN UA: 0.2 (ref 0.0–1.0)
Urine Glucose: NEGATIVE
WBC UA: NONE SEEN (ref 0–?)

## 2016-05-04 LAB — CBC WITH DIFFERENTIAL/PLATELET
Basophils Absolute: 0 10*3/uL (ref 0.0–0.1)
Basophils Relative: 0.5 % (ref 0.0–3.0)
Eosinophils Absolute: 0.1 10*3/uL (ref 0.0–0.7)
Eosinophils Relative: 2.6 % (ref 0.0–5.0)
HCT: 45.3 % (ref 39.0–52.0)
Hemoglobin: 15.3 g/dL (ref 13.0–17.0)
Lymphocytes Relative: 37.6 % (ref 12.0–46.0)
Lymphs Abs: 2.1 10*3/uL (ref 0.7–4.0)
MCHC: 33.8 g/dL (ref 30.0–36.0)
MCV: 85.6 fl (ref 78.0–100.0)
Monocytes Absolute: 0.4 10*3/uL (ref 0.1–1.0)
Monocytes Relative: 6.9 % (ref 3.0–12.0)
Neutro Abs: 3 10*3/uL (ref 1.4–7.7)
Neutrophils Relative %: 52.4 % (ref 43.0–77.0)
Platelets: 232 10*3/uL (ref 150.0–400.0)
RBC: 5.29 Mil/uL (ref 4.22–5.81)
RDW: 14.1 % (ref 11.5–15.5)
WBC: 5.7 10*3/uL (ref 4.0–10.5)

## 2016-05-04 LAB — BASIC METABOLIC PANEL
BUN: 13 mg/dL (ref 6–23)
CHLORIDE: 104 meq/L (ref 96–112)
CO2: 27 meq/L (ref 19–32)
CREATININE: 1.13 mg/dL (ref 0.40–1.50)
Calcium: 9.9 mg/dL (ref 8.4–10.5)
GFR: 78.61 mL/min (ref >60.00)
GLUCOSE: 105 mg/dL — AB (ref 70–99)
Potassium: 4 mEq/L (ref 3.5–5.1)
Sodium: 139 mEq/L (ref 135–145)

## 2016-05-04 NOTE — Progress Notes (Signed)
Subjective:  Patient ID: Chase Parker, male    DOB: [birth year]  Age: 35 y.o. MRN: [id number]  CC: Follow-up   HPI Chase Parker presents for follow-up after being diagnosed with syphilis about 6 months ago. He is also taking Truvada to prevent HIV infection. He continues to have high risk sexual behaviors and wants to keep taking Truvada. He has recently felt well with no episodes of penile ulcers, lymphadenopathy, rash, fever, chills, night sweats, abdominal pain, or nausea.  Outpatient Medications Prior to Visit  Medication Sig Dispense Refill  . sildenafil (VIAGRA) 100 MG tablet Take 0.5-1 tablets (50-100 mg total) by mouth daily as needed for erectile dysfunction. (Patient not taking: Reported on 05/04/2016) 3 tablet 6  . TRUVADA 200-300 MG tablet TAKE 1 TABLET BY MOUTH EVERY DAY 90 tablet 1   No facility-administered medications prior to visit.     ROS Review of Systems  Constitutional: Negative for activity change, chills, fatigue, fever and unexpected weight change.  HENT: Negative.  Negative for sore throat and trouble swallowing.   Eyes: Negative for visual disturbance.  Respiratory: Negative for cough, chest tightness, shortness of breath and stridor.   Cardiovascular: Negative for chest pain, palpitations and leg swelling.  Gastrointestinal: Negative.  Negative for abdominal pain, blood in stool, constipation, diarrhea, nausea and vomiting.  Endocrine: Negative.   Genitourinary: Negative for difficulty urinating, discharge, dysuria, genital sores, penile swelling, scrotal swelling and testicular pain.  Musculoskeletal: Negative.  Negative for arthralgias and myalgias.  Skin: Negative.  Negative for color change.  Allergic/Immunologic: Negative.   Neurological: Negative.   Hematological: Negative.  Negative for adenopathy. Does not bruise/bleed easily.  Psychiatric/Behavioral: Negative.     Objective:  BP 140/88 (BP Location: Left Arm, Patient Position: Sitting, Cuff  Size: Normal)   Pulse 70   Temp 98.1 F (36.7 C) (Oral)   Resp 16   Ht 5\' 6"  (1.676 m)   Wt 199 lb 8 oz (90.5 kg)   SpO2 100%   BMI 32.20 kg/m   BP Readings from Last 3 Encounters:  05/04/16 140/88  10/24/15 126/80  09/23/15 110/74    Wt Readings from Last 3 Encounters:  05/04/16 199 lb 8 oz (90.5 kg)  10/24/15 201 lb (91.2 kg)  09/23/15 200 lb (90.7 kg)    Physical Exam  Constitutional: He is oriented to person, place, and time. No distress.  HENT:  Mouth/Throat: Oropharynx is clear and moist. No oropharyngeal exudate.  Eyes: Conjunctivae are normal. Right eye exhibits no discharge. Left eye exhibits no discharge. No scleral icterus.  Neck: Normal range of motion. No JVD present. No tracheal deviation present. No thyromegaly present.  Cardiovascular: Normal rate, regular rhythm, normal heart sounds and intact distal pulses.  Exam reveals no gallop and no friction rub.   No murmur heard. Pulmonary/Chest: Effort normal and breath sounds normal. No stridor. No respiratory distress. He has no wheezes. He has no rales. He exhibits no tenderness.  Abdominal: Soft. Bowel sounds are normal. He exhibits no distension and no mass. There is no tenderness. There is no rebound and no guarding.  Musculoskeletal: Normal range of motion. He exhibits no edema, tenderness or deformity.  Lymphadenopathy:    He has no cervical adenopathy.  Neurological: He is oriented to person, place, and time.  Skin: Skin is warm and dry. No rash noted. He is not diaphoretic. No erythema. No pallor.  Vitals reviewed.   Lab Results  Component Value Date   WBC 5.7 05/04/2016  HGB 15.3 05/04/2016   HCT 45.3 05/04/2016   PLT 232.0 05/04/2016   GLUCOSE 105 (H) 05/04/2016   CHOL 165 07/10/2015   TRIG 126.0 07/10/2015   HDL 36.80 (L) 07/10/2015   LDLDIRECT 135.0 08/09/2014   LDLCALC 103 (H) 07/10/2015   ALT 51 10/24/2015   AST 28 10/24/2015   NA 139 05/04/2016   K 4.0 05/04/2016   CL 104 05/04/2016     CREATININE 1.13 05/04/2016   BUN 13 05/04/2016   CO2 27 05/04/2016   TSH 1.07 07/10/2015   INR 1.07 02/28/2015    No results found.  Assessment & Plan:   Chase Parker was seen today for follow-up.  Diagnoses and all orders for this visit:  Early syphilis, genital (primary)- his RPR is negative now. This has been adequately treated. Will continue to monitor this every 6-8 months. -     HIV antibody; Future -     RPR; Future  High risk sexual behavior- his renal function remains normal and he is HIV negative so he can continue taking Truvada. Screening for other STI's is negative. -     CBC with Differential/Platelet; Future -     Basic metabolic panel; Future -     Urinalysis, Routine w reflex microscopic (not at [facility]); Future -     GC/Chlamydia Probe Amp; Future -     HIV antibody; Future  Need for prophylactic vaccination and inoculation against influenza -     Flu Vaccine QUAD 36+ mos IM   I am having Mr. Chase Parker maintain his sildenafil and TRUVADA.  No orders of the defined types were placed in this encounter.    Follow-up: No Follow-up on file.  Scarlette Calico, MD

## 2016-05-04 NOTE — Progress Notes (Signed)
Pre visit review using our clinic review tool, if applicable. No additional management support is needed unless otherwise documented below in the visit note. 

## 2016-05-05 ENCOUNTER — Encounter: Payer: Self-pay | Admitting: Internal Medicine

## 2016-05-05 LAB — CUP PACEART INCLINIC DEVICE CHECK
Implantable Lead Implant Date: 20061205
Implantable Lead Location: 753860
Implantable Pulse Generator Implant Date: 20061205
Lead Channel Pacing Threshold Amplitude: 1.25 V
Lead Channel Pacing Threshold Pulse Width: 0.4 ms
Lead Channel Sensing Intrinsic Amplitude: 5 mV
Lead Channel Sensing Intrinsic Amplitude: 8.1 mV
Lead Channel Setting Pacing Amplitude: 2 V
Lead Channel Setting Pacing Amplitude: 2.5 V
MDC IDC LEAD IMPLANT DT: 20061205
MDC IDC LEAD LOCATION: 753859
MDC IDC MSMT LEADCHNL RA PACING THRESHOLD AMPLITUDE: 0.5 V
MDC IDC MSMT LEADCHNL RV PACING THRESHOLD PULSEWIDTH: 0.4 ms
MDC IDC PG SERIAL: 1546854
MDC IDC SESS DTM: 20171205090934
MDC IDC SET LEADCHNL RV PACING PULSEWIDTH: 1 ms
MDC IDC SET LEADCHNL RV SENSING SENSITIVITY: 2 mV
MDC IDC STAT BRADY RA PERCENT PACED: 1.6 %

## 2016-05-05 LAB — HIV ANTIBODY (ROUTINE TESTING W REFLEX): HIV 1&2 Ab, 4th Generation: NONREACTIVE

## 2016-05-05 LAB — RPR

## 2016-05-05 LAB — GC/CHLAMYDIA PROBE AMP
CT Probe RNA: NOT DETECTED
GC PROBE AMP APTIMA: NOT DETECTED

## 2016-05-05 NOTE — Progress Notes (Signed)
Pacemaker check in clinic. Normal device function. Thresholds, sensing, impedances consistent with previous measurements. Device programmed to maximize longevity. No mode switch or high ventricular rates noted. Device programmed at appropriate safety margins. Histogram distribution appropriate for patient activity level. Device programmed to optimize intrinsic conduction. Estimated longevity 3.25-7.86yrs. ROV with JA 08/2016 . Patient education completed.

## 2016-05-05 NOTE — Patient Instructions (Signed)
Safe Sex Safe sex is about reducing the risk of giving or getting a sexually transmitted disease (STD). STDs are spread through sexual contact involving the genitals, mouth, or rectum. Some STDs can be cured and others cannot. Safe sex can also prevent unintended pregnancies.  WHAT ARE SOME SAFE SEX PRACTICES?  Limit your sexual activity to only one partner who is having sex with only you.  Talk to your partner about his or her past partners, past STDs, and drug use.  Use a condom every time you have sexual intercourse. This includes vaginal, oral, and anal sexual activity. Both females and males should wear condoms during oral sex. Only use latex or polyurethane condoms and water-based lubricants. Using petroleum-based lubricants or oils to lubricate a condom will weaken the condom and increase the chance that it will break. The condom should be in place from the beginning to the end of sexual activity. Wearing a condom reduces, but does not completely eliminate, your risk of getting or giving an STD. STDs can be spread by contact with infected body fluids and skin.  Get vaccinated for hepatitis B and HPV.  Avoid alcohol and recreational drugs, which can affect your judgment. You may forget to use a condom or participate in high-risk sex.  For females, avoid douching after sexual intercourse. Douching can spread an infection farther into the reproductive tract.  Check your body for signs of sores, blisters, rashes, or unusual discharge. See your health care provider if you notice any of these signs.  Avoid sexual contact if you have symptoms of an infection or are being treated for an STD. If you or your partner has herpes, avoid sexual contact when blisters are present. Use condoms at all other times.  If you are at risk of being infected with HIV, it is recommended that you take a prescription medicine daily to prevent HIV infection. This is called pre-exposure prophylaxis (PrEP). You are  considered at risk if:  You are a man who has sex with other men (MSM).  You are a heterosexual man or woman who is sexually active with more than one partner.  You take drugs by injection.  You are sexually active with a partner who has HIV.  Talk with your health care provider about whether you are at high risk of being infected with HIV. If you choose to begin PrEP, you should first be tested for HIV. You should then be tested every 3 months for as long as you are taking PrEP.  See your health care provider for regular screenings, exams, and tests for other STDs. Before having sex with a new partner, each of you should be screened for STDs and should talk about the results with each other. WHAT ARE THE BENEFITS OF SAFE SEX?   There is less chance of getting or giving an STD.  You can prevent unwanted or unintended pregnancies.  By discussing safe sex concerns with your partner, you may increase feelings of intimacy, comfort, trust, and honesty between the two of you. This information is not intended to replace advice given to you by your health care provider. Make sure you discuss any questions you have with your health care provider. Document Released: 06/25/2004 Document Revised: 06/08/2014 Document Reviewed: 04/07/2015 Elsevier Interactive Patient Education  2017 [employer].

## 2016-05-27 ENCOUNTER — Ambulatory Visit: Payer: Self-pay

## 2016-10-19 ENCOUNTER — Ambulatory Visit (INDEPENDENT_AMBULATORY_CARE_PROVIDER_SITE_OTHER): Payer: Managed Care, Other (non HMO) | Admitting: Internal Medicine

## 2016-10-19 ENCOUNTER — Encounter: Payer: Self-pay | Admitting: Internal Medicine

## 2016-10-19 VITALS — BP 126/72 | HR 72 | Ht 66.75 in | Wt 200.4 lb

## 2016-10-19 DIAGNOSIS — I495 Sick sinus syndrome: Secondary | ICD-10-CM | POA: Diagnosis not present

## 2016-10-19 LAB — CUP PACEART INCLINIC DEVICE CHECK
Battery Impedance: 2400 Ohm
Brady Statistic RA Percent Paced: 9.1 %
Brady Statistic RV Percent Paced: 1.5 %
Implantable Lead Implant Date: 20061205
Implantable Lead Implant Date: 20061205
Implantable Lead Location: 753860
Lead Channel Pacing Threshold Amplitude: 0.5 V
Lead Channel Pacing Threshold Amplitude: 1.25 V
Lead Channel Pacing Threshold Pulse Width: 0.4 ms
Lead Channel Sensing Intrinsic Amplitude: 5 mV
Lead Channel Sensing Intrinsic Amplitude: 8.8 mV
Lead Channel Setting Pacing Pulse Width: 1 ms
Lead Channel Setting Sensing Sensitivity: 2 mV
MDC IDC LEAD LOCATION: 753859
MDC IDC MSMT BATTERY VOLTAGE: 2.76 V
MDC IDC MSMT LEADCHNL RA IMPEDANCE VALUE: 616 Ohm
MDC IDC MSMT LEADCHNL RV IMPEDANCE VALUE: 300 Ohm
MDC IDC MSMT LEADCHNL RV PACING THRESHOLD PULSEWIDTH: 1 ms
MDC IDC PG IMPLANT DT: 20061205
MDC IDC SESS DTM: 20180521164951
MDC IDC SET LEADCHNL RA PACING AMPLITUDE: 2 V
MDC IDC SET LEADCHNL RV PACING AMPLITUDE: 2.5 V
Pulse Gen Serial Number: 1546854

## 2016-10-19 NOTE — Patient Instructions (Signed)
Medication Instructions:  Your physician recommends that you continue on your current medications as directed. Please refer to the Current Medication list given to you today.    Follow-Up: Your physician wants you to follow-up in: 6 months with Device clinic and 1 year with Dr. Rayann Heman. You will receive a reminder letter in the mail two months in advance. If you don't receive a letter, please call our office to schedule the follow-up appointment.   Any Other Special Instructions Will Be Listed Below (If Applicable).     If you need a refill on your cardiac medications before your next appointment, please call your pharmacy.

## 2016-10-19 NOTE — Progress Notes (Signed)
PCP: Janith Lima, MD  Chase Parker is a 36 y.o. male who presents today for routine electrophysiology followup.  Since last being seen in our clinic, the patient reports doing very well.  Today, he denies symptoms of palpitations, chest pain, shortness of breath,  lower extremity edema, dizziness, presyncope, or syncope.  The patient is otherwise without complaint today.   Past Medical History:  Diagnosis Date  . GERD (gastroesophageal reflux disease)   . Hepatitis    hep B  . Mass of left submandibular region 12/2014   CT scan of neck performed on 01/16/2015, showed no obvious invasion of surrounding structure, no lymphadenopathy, plan: left submandibular gland exicision intending adequate surgical margins  . Neoplasm of submandibular gland    Dr. Vicie Mutters at [facility], recommends removal and node dissection  . 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 Ignacia Bayley  . 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 Sick sinus syndrome [facility])    s/p PPM implant in [place]   Past Surgical History:  Procedure Laterality Date  . LEG SURGERY     left    age 76  . PACEMAKER INSERTION  05/06/2005   SJM implanted at [facility] in [place]  . SUBMANDIBULAR GLAND EXCISION Left 03/06/2015  . SUBMANDIBULAR GLAND EXCISION Left 03/06/2015   Procedure: EXCISION  LEFT SUBMANDIBULAR GLAND;  Surgeon: Jodi Marble, MD;  Location: [facility];  Service: ENT;  Laterality: Left;    ROS- all systems are reviewed and negative except as per HPI above  Current Outpatient Prescriptions  Medication Sig Dispense Refill  . Multiple Vitamin (MULTI-VITAMINS) TABS Take 1 tablet by mouth daily.    . TRUVADA 200-300 MG tablet TAKE 1 TABLET BY MOUTH EVERY DAY 90 tablet 1   No current facility-administered medications for this visit.     Physical Exam: Vitals:   10/19/16 1608  BP: 126/72  Pulse: 72  SpO2: 97%  Weight: 200 lb 6.4 oz (90.9 kg)  Height: 5' 6.75"  (1.695 m)    GEN- The patient is well appearing, alert and oriented x 3 today.   Head- normocephalic, atraumatic Eyes-  Sclera clear, conjunctiva pink Ears- hearing intact Oropharynx- clear Lungs- Clear to ausculation bilaterally, normal work of breathing Chest- pacemaker pocket is well healed Heart- Regular rate and rhythm, no murmurs, rubs or gallops, PMI not laterally displaced GI- soft, NT, ND, + BS Extremities- no clubbing, cyanosis, or edema  Pacemaker interrogation- reviewed in detail today,  See PACEART report  ekg today reveals sinus rhythm 72 bpm, otherwise normal ekg  Assessment and Plan:  1. Sick sinus syndrome Normal pacemaker function See Claudia Desanctis Art report No changes today  He is going to try to establish care in [place] where he now lives.  I have given him the name of Caren Macadam MD (EP in [place]) as a good option to consider.  We will go ahead and schedule follow-up with device clinic in 6 months and with me in a year.  If he establishes with EP closer to his new home in [place] then he will call our office and cancel follow-up with [place].  It has been a real pleasure to be a part of his care and I wish him well.  Thompson Grayer MD, [facility] 10/19/2016 4:59 PM

## 2016-10-29 ENCOUNTER — Telehealth: Payer: Self-pay | Admitting: Internal Medicine

## 2016-10-29 NOTE — Telephone Encounter (Signed)
New message     1. Are you calling in reference to your FMLA or disability form? NO  2. What is your question in regards to FMLA or disability form? None   3. Do you need copies of your medical records? Yes  4. Are you waiting on a nurse to call you back with results or are you wanting copies of your results? NO, [facility] needs medical records faxed for patient transitioning to their [place] office... Needs Labs, office notes, ekg and any other cardiac testings Fax: [phone]

## 2016-10-29 NOTE — Telephone Encounter (Signed)
Spoke with patient he stated he signed ROI with [facility]. Once I receive ROI I can process records.
